# Patient Record
Sex: Female | Born: 1965 | Hispanic: No | Marital: Married | Smoking: Current every day smoker
Health system: Southern US, Community
[De-identification: ages and names within clinical notes are randomized; demographics above are authoritative.]

## PROBLEM LIST (undated history)

## (undated) DIAGNOSIS — E119 Type 2 diabetes mellitus without complications: Secondary | ICD-10-CM

## (undated) DIAGNOSIS — I10 Essential (primary) hypertension: Secondary | ICD-10-CM

## (undated) DIAGNOSIS — E7800 Pure hypercholesterolemia, unspecified: Secondary | ICD-10-CM

## (undated) HISTORY — DX: Type 2 diabetes mellitus without complications: E11.9

## (undated) HISTORY — PX: APPENDECTOMY: SHX54

---

## 1999-01-16 ENCOUNTER — Other Ambulatory Visit: Admission: RE | Admit: 1999-01-16 | Discharge: 1999-01-16 | Payer: Self-pay | Admitting: Obstetrics and Gynecology

## 1999-05-23 ENCOUNTER — Emergency Department (HOSPITAL_COMMUNITY): Admission: EM | Admit: 1999-05-23 | Discharge: 1999-05-23 | Payer: Self-pay | Admitting: Emergency Medicine

## 1999-05-23 ENCOUNTER — Encounter: Payer: Self-pay | Admitting: Emergency Medicine

## 2002-10-19 ENCOUNTER — Ambulatory Visit (HOSPITAL_COMMUNITY): Admission: RE | Admit: 2002-10-19 | Discharge: 2002-10-19 | Payer: Self-pay | Admitting: Family Medicine

## 2003-10-27 ENCOUNTER — Ambulatory Visit (HOSPITAL_COMMUNITY): Admission: RE | Admit: 2003-10-27 | Discharge: 2003-10-27 | Payer: Self-pay | Admitting: Family Medicine

## 2004-02-23 ENCOUNTER — Ambulatory Visit: Payer: Self-pay | Admitting: Nurse Practitioner

## 2004-03-01 ENCOUNTER — Ambulatory Visit: Payer: Self-pay | Admitting: Nurse Practitioner

## 2004-04-10 ENCOUNTER — Ambulatory Visit: Payer: Self-pay | Admitting: Nurse Practitioner

## 2004-07-18 ENCOUNTER — Ambulatory Visit: Payer: Self-pay | Admitting: Nurse Practitioner

## 2004-08-22 ENCOUNTER — Ambulatory Visit: Payer: Self-pay | Admitting: Nurse Practitioner

## 2004-08-24 ENCOUNTER — Ambulatory Visit (HOSPITAL_COMMUNITY): Admission: RE | Admit: 2004-08-24 | Discharge: 2004-08-24 | Payer: Self-pay | Admitting: Nurse Practitioner

## 2004-08-26 ENCOUNTER — Ambulatory Visit: Payer: Self-pay | Admitting: *Deleted

## 2004-12-03 ENCOUNTER — Ambulatory Visit: Payer: Self-pay | Admitting: Nurse Practitioner

## 2005-04-21 ENCOUNTER — Ambulatory Visit: Payer: Self-pay | Admitting: Nurse Practitioner

## 2005-07-27 ENCOUNTER — Emergency Department (HOSPITAL_COMMUNITY): Admission: EM | Admit: 2005-07-27 | Discharge: 2005-07-27 | Payer: Self-pay | Admitting: Emergency Medicine

## 2005-08-07 ENCOUNTER — Ambulatory Visit: Payer: Self-pay | Admitting: Nurse Practitioner

## 2005-10-29 ENCOUNTER — Encounter: Admission: RE | Admit: 2005-10-29 | Discharge: 2006-01-27 | Payer: Self-pay | Admitting: Sports Medicine

## 2005-11-24 ENCOUNTER — Ambulatory Visit: Payer: Self-pay | Admitting: Nurse Practitioner

## 2005-11-24 ENCOUNTER — Other Ambulatory Visit: Admission: RE | Admit: 2005-11-24 | Discharge: 2005-11-24 | Payer: Self-pay | Admitting: Nurse Practitioner

## 2005-11-24 ENCOUNTER — Encounter (INDEPENDENT_AMBULATORY_CARE_PROVIDER_SITE_OTHER): Payer: Self-pay | Admitting: *Deleted

## 2006-01-28 ENCOUNTER — Encounter: Admission: RE | Admit: 2006-01-28 | Discharge: 2006-04-28 | Payer: Self-pay | Admitting: Sports Medicine

## 2006-03-06 ENCOUNTER — Ambulatory Visit: Payer: Self-pay | Admitting: Internal Medicine

## 2006-08-28 ENCOUNTER — Ambulatory Visit: Payer: Self-pay | Admitting: Internal Medicine

## 2006-09-17 ENCOUNTER — Ambulatory Visit: Payer: Self-pay | Admitting: Internal Medicine

## 2006-11-25 ENCOUNTER — Encounter (INDEPENDENT_AMBULATORY_CARE_PROVIDER_SITE_OTHER): Payer: Self-pay | Admitting: *Deleted

## 2007-04-27 ENCOUNTER — Ambulatory Visit: Payer: Self-pay | Admitting: Family Medicine

## 2007-04-27 LAB — CONVERTED CEMR LAB
ALT: 25 units/L (ref 0–35)
AST: 17 units/L (ref 0–37)
CO2: 22 meq/L (ref 19–32)
Chloride: 107 meq/L (ref 96–112)
Cholesterol: 134 mg/dL (ref 0–200)
LDL Cholesterol: 55 mg/dL (ref 0–99)
Sodium: 141 meq/L (ref 135–145)
Total Bilirubin: 0.5 mg/dL (ref 0.3–1.2)
Total CHOL/HDL Ratio: 3.8
Total Protein: 7 g/dL (ref 6.0–8.3)
VLDL: 44 mg/dL — ABNORMAL HIGH (ref 0–40)

## 2008-01-18 ENCOUNTER — Ambulatory Visit: Payer: Self-pay | Admitting: Internal Medicine

## 2008-02-02 ENCOUNTER — Encounter (INDEPENDENT_AMBULATORY_CARE_PROVIDER_SITE_OTHER): Payer: Self-pay | Admitting: Internal Medicine

## 2008-02-02 ENCOUNTER — Ambulatory Visit: Payer: Self-pay | Admitting: Internal Medicine

## 2008-02-02 LAB — CONVERTED CEMR LAB
AST: 17 units/L (ref 0–37)
Albumin: 3.9 g/dL (ref 3.5–5.2)
BUN: 12 mg/dL (ref 6–23)
CO2: 20 meq/L (ref 19–32)
Calcium: 9.2 mg/dL (ref 8.4–10.5)
Chloride: 108 meq/L (ref 96–112)
Cholesterol: 111 mg/dL (ref 0–200)
Creatinine, Ser: 0.51 mg/dL (ref 0.40–1.20)
Glucose, Bld: 126 mg/dL — ABNORMAL HIGH (ref 70–99)
HDL: 34 mg/dL — ABNORMAL LOW (ref >39)
Hemoglobin: 14.4 g/dL (ref 12.0–15.0)
Lymphocytes Relative: 53 % — ABNORMAL HIGH (ref 12–46)
Lymphs Abs: 3 10*3/uL (ref 0.7–4.0)
MCHC: 32.6 g/dL (ref 30.0–36.0)
Monocytes Absolute: 0.4 10*3/uL (ref 0.1–1.0)
Monocytes Relative: 8 % (ref 3–12)
Neutro Abs: 2 10*3/uL (ref 1.7–7.7)
Neutrophils Relative %: 35 % — ABNORMAL LOW (ref 43–77)
Potassium: 3.9 meq/L (ref 3.5–5.3)
RBC: 5.34 M/uL — ABNORMAL HIGH (ref 3.87–5.11)
Total CHOL/HDL Ratio: 3.3
WBC: 5.6 10*3/uL (ref 4.0–10.5)

## 2008-08-29 ENCOUNTER — Ambulatory Visit: Payer: Self-pay | Admitting: Family Medicine

## 2008-08-30 ENCOUNTER — Ambulatory Visit (HOSPITAL_COMMUNITY): Admission: RE | Admit: 2008-08-30 | Discharge: 2008-08-30 | Payer: Self-pay | Admitting: Internal Medicine

## 2009-06-04 ENCOUNTER — Encounter (INDEPENDENT_AMBULATORY_CARE_PROVIDER_SITE_OTHER): Payer: Self-pay | Admitting: Family Medicine

## 2009-06-04 ENCOUNTER — Ambulatory Visit: Payer: Self-pay | Admitting: Internal Medicine

## 2009-06-04 LAB — CONVERTED CEMR LAB
Albumin: 4.7 g/dL (ref 3.5–5.2)
Alkaline Phosphatase: 57 units/L (ref 39–117)
BUN: 12 mg/dL (ref 6–23)
CO2: 26 meq/L (ref 19–32)
Cholesterol: 141 mg/dL (ref 0–200)
Glucose, Bld: 81 mg/dL (ref 70–99)
HDL: 40 mg/dL (ref >39)
LDL Cholesterol: 66 mg/dL (ref 0–99)
Sodium: 141 meq/L (ref 135–145)
Total Bilirubin: 0.5 mg/dL (ref 0.3–1.2)
Total Protein: 7.2 g/dL (ref 6.0–8.3)
Triglycerides: 174 mg/dL — ABNORMAL HIGH (ref <150)
VLDL: 35 mg/dL (ref 0–40)

## 2009-06-26 ENCOUNTER — Ambulatory Visit: Payer: Self-pay | Admitting: Internal Medicine

## 2009-07-12 ENCOUNTER — Ambulatory Visit: Payer: Self-pay | Admitting: Obstetrics and Gynecology

## 2009-07-26 ENCOUNTER — Ambulatory Visit: Payer: Self-pay | Admitting: Internal Medicine

## 2009-07-26 LAB — CONVERTED CEMR LAB
AST: 23 units/L (ref 0–37)
Albumin: 4.4 g/dL (ref 3.5–5.2)
Alkaline Phosphatase: 52 units/L (ref 39–117)
Calcium: 9 mg/dL (ref 8.4–10.5)
Chloride: 107 meq/L (ref 96–112)
Hep A Total Ab: POSITIVE — AB
Hep B S Ab: POSITIVE — AB
Potassium: 4.2 meq/L (ref 3.5–5.3)
Sodium: 142 meq/L (ref 135–145)
Total Protein: 6.6 g/dL (ref 6.0–8.3)

## 2009-08-03 ENCOUNTER — Ambulatory Visit: Payer: Self-pay | Admitting: Obstetrics & Gynecology

## 2009-08-15 ENCOUNTER — Ambulatory Visit: Payer: Self-pay | Admitting: Obstetrics & Gynecology

## 2009-09-14 ENCOUNTER — Ambulatory Visit: Payer: Self-pay | Admitting: Obstetrics and Gynecology

## 2010-02-21 ENCOUNTER — Encounter (INDEPENDENT_AMBULATORY_CARE_PROVIDER_SITE_OTHER): Payer: Self-pay | Admitting: *Deleted

## 2010-02-21 LAB — CONVERTED CEMR LAB
ALT: 29 units/L (ref 0–35)
AST: 22 units/L (ref 0–37)
LDL Cholesterol: 102 mg/dL — ABNORMAL HIGH (ref 0–99)

## 2010-05-27 LAB — POCT PREGNANCY, URINE
Preg Test, Ur: NEGATIVE
Preg Test, Ur: NEGATIVE

## 2016-09-24 ENCOUNTER — Encounter (HOSPITAL_COMMUNITY): Payer: Self-pay | Admitting: *Deleted

## 2016-09-24 ENCOUNTER — Emergency Department (HOSPITAL_COMMUNITY): Payer: No Typology Code available for payment source

## 2016-09-24 ENCOUNTER — Emergency Department (HOSPITAL_COMMUNITY)
Admission: EM | Admit: 2016-09-24 | Discharge: 2016-09-24 | Disposition: A | Discharge status: Home / Self Care | Payer: No Typology Code available for payment source | Attending: Emergency Medicine | Admitting: Emergency Medicine

## 2016-09-24 DIAGNOSIS — R0789 Other chest pain: Secondary | ICD-10-CM | POA: Diagnosis not present

## 2016-09-24 DIAGNOSIS — F17210 Nicotine dependence, cigarettes, uncomplicated: Secondary | ICD-10-CM | POA: Insufficient documentation

## 2016-09-24 DIAGNOSIS — S161XXA Strain of muscle, fascia and tendon at neck level, initial encounter: Secondary | ICD-10-CM | POA: Insufficient documentation

## 2016-09-24 DIAGNOSIS — Z79899 Other long term (current) drug therapy: Secondary | ICD-10-CM | POA: Diagnosis not present

## 2016-09-24 DIAGNOSIS — I10 Essential (primary) hypertension: Secondary | ICD-10-CM | POA: Insufficient documentation

## 2016-09-24 DIAGNOSIS — Y999 Unspecified external cause status: Secondary | ICD-10-CM | POA: Diagnosis not present

## 2016-09-24 DIAGNOSIS — S199XXA Unspecified injury of neck, initial encounter: Secondary | ICD-10-CM | POA: Diagnosis present

## 2016-09-24 DIAGNOSIS — Y9389 Activity, other specified: Secondary | ICD-10-CM | POA: Insufficient documentation

## 2016-09-24 DIAGNOSIS — Y929 Unspecified place or not applicable: Secondary | ICD-10-CM | POA: Insufficient documentation

## 2016-09-24 HISTORY — DX: Essential (primary) hypertension: I10

## 2016-09-24 HISTORY — DX: Pure hypercholesterolemia, unspecified: E78.00

## 2016-09-24 MED ORDER — NAPROXEN 375 MG PO TABS: 375.0000 mg | ORAL_TABLET | Freq: Two times a day (BID) | ORAL | 0 refills | Status: DC

## 2016-09-24 MED ORDER — CYCLOBENZAPRINE HCL 10 MG PO TABS: 10.0000 mg | ORAL_TABLET | Freq: Three times a day (TID) | ORAL | 0 refills | Status: DC | PRN

## 2016-09-24 MED ORDER — HYDROCODONE-ACETAMINOPHEN 5-325 MG PO TABS
1.0000 | ORAL_TABLET | Freq: Once | ORAL | Status: AC
  Administered 2016-09-24: 1 via ORAL
  Filled 2016-09-24: qty 1

## 2016-09-24 MED ORDER — NAPROXEN 250 MG PO TABS
375.0000 mg | ORAL_TABLET | Freq: Once | ORAL | Status: AC
  Administered 2016-09-24: 375 mg via ORAL
  Filled 2016-09-24: qty 2
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
## 2016-09-24 NOTE — Discharge Instructions (Signed)
All of your imaging has been normal. This is likely musculoskeletal pain. Please take the Naproxen as prescribed for pain. Do not take any additional NSAIDs including Motrin, Aleve, Ibuprofen, Advil. Have given your first dose today in the ED  Please the the Flexeril for muscle relaxation. This medication will make you drowsy so avoid situation that could place you in danger.   Warm compresses and warm soaks in Epsom salt. If you not feeling any better in the next 4-5 days follow-up with primary care doctor, orthopedist or return to the ED.

## 2016-12-18 ENCOUNTER — Ambulatory Visit (INDEPENDENT_AMBULATORY_CARE_PROVIDER_SITE_OTHER): Payer: BLUE CROSS/BLUE SHIELD | Admitting: Physician Assistant

## 2016-12-18 VITALS — BP 140/80 | HR 85 | Temp 97.7°F | Resp 16 | Ht <= 58 in | Wt 170.0 lb

## 2016-12-18 DIAGNOSIS — E785 Hyperlipidemia, unspecified: Secondary | ICD-10-CM

## 2016-12-18 DIAGNOSIS — N939 Abnormal uterine and vaginal bleeding, unspecified: Secondary | ICD-10-CM | POA: Diagnosis not present

## 2016-12-18 NOTE — Patient Instructions (Addendum)
I will contact you with the lab results.  We will then decide on the appropriate medicine for the cholesterol, if we need it Please await contact for referral to the gynecologist.       IF you received an x-ray today, you will receive an invoice from [facility]. Please contact [facility] at [phone] with questions or concerns regarding your invoice.   IF you received labwork today, you will receive an invoice from [facility]. Please contact [facility] at [phone] with questions or concerns regarding your invoice.   Our billing staff will not be able to assist you with questions regarding bills from these companies.  You will be contacted with the lab results as soon as they are available. The fastest way to get your results is to activate your My Chart account. Instructions are located on the last page of this paperwork. If you have not heard from [place] regarding the results in 2 weeks, please contact this office.

## 2016-12-18 NOTE — Progress Notes (Signed)
PRIMARY CARE AT [facility] [address], [place] [address] [phone]  Date:  12/18/2016   Name:  Tracy Sanders   DOB:  10-06-1965   MRN:  [id number]  PCP:  Patient, No Pcp Per    History of Present Illness:  Tracy Sanders is a 51 y.o. female patient who presents to PCP with  Chief Complaint  Patient presents with  . Medication Refill    pt was taking cholestrol med awhile back and would like blood work to check her levels.  . Menstrual Problem    pt wants to know why she is still getting a period at her age.     bosnian teletranslator utilized during visit. --6 years ago she was on a cholesterol medication, but she has not.  She did not have insurance at the time, so she had stopped.  She was diagnosed with elevated cholesterol for 11 years.   --she had no side effects with taking the cholesterol medication.   --soups, pies, homebaked everything.  She brings in food from home.   --she has some soda, more lemonade.   --no etOH use.    Menstrual problem --2 weeks of period --she may go through 5 pads per day.  She has heavy cramping.  She will have advil to help her symptoms.  She has bleeding with associated, fatigue, dizziness.  No chest pains.  5 children.  Vaginal delivery.  She has not had abnormal periods a lot.  She has some bloating.  Last visit was 2 years ago.    There are no active problems to display for this patient.   Past Medical History:  Diagnosis Date  . Hypercholesteremia   . Hypertension     Past Surgical History:  Procedure Laterality Date  . APPENDECTOMY      Social History  Substance Use Topics  . Smoking status: Current Every Day Smoker    Packs/day: 0.50  . Smokeless tobacco: Never Used  . Alcohol use No    No family history on file.  Allergies  Allergen Reactions  . Other     "a cold medicine"  - not sure which one    Medication list has been reviewed and updated.  No current outpatient prescriptions on file prior to  visit.   No current facility-administered medications on file prior to visit.     ROS ROS otherwise unremarkable unless listed above.  Physical Examination: BP 140/80   Pulse 85   Temp 97.7 F (36.5 C) (Oral)   Resp 16   Ht '4\' 9"'  (1.448 m)   Wt 170 lb (77.1 kg)   SpO2 98%   BMI 36.79 kg/m  Ideal Body Weight: Weight in (lb) to have BMI = 25: 115.3  Physical Exam  Constitutional: She is oriented to person, place, and time. She appears well-developed and well-nourished. No distress.  HENT:  Head: Normocephalic and atraumatic.  Right Ear: External ear normal.  Left Ear: External ear normal.  Eyes: Pupils are equal, round, and reactive to light. Conjunctivae and EOM are normal.  Cardiovascular: Normal rate.   Pulmonary/Chest: Effort normal. No respiratory distress.  Genitourinary: Vagina normal. Uterus is tender. Right adnexum displays no mass. Left adnexum displays no mass.  Neurological: She is alert and oriented to person, place, and time.  Skin: She is not diaphoretic.  Psychiatric: She has a normal mood and affect. Her behavior is normal.     Assessment and Plan: Tracy Sanders is a 51 y.o. female who is  here today for cc of  Chief Complaint  Patient presents with  . Medication Refill    pt was taking cholestrol med awhile back and would like blood work to check her levels.  . Menstrual Problem    pt wants to know why she is still getting a period at her age.  possible endometriosis, endometrial cancer, fibroids, myoma.   Referring to gynecology at this time.  Discussed the alternate plan of obtaining a vaginal ultrasound/pelvic, but she is agreeable to the referral. She will likely need additional labs--fsh/lh, estrogen, but will hold off for the consult. Adding TSH Abnormal uterine bleeding - Plan: CBC, CMP14+EGFR, Pap IG, CT/NG w/ reflex HPV when ASC-U, Ambulatory referral to Gynecology  Ivar Drape, PA-C [facility] 10/15/20188:10 AM

## 2016-12-19 LAB — CBC
Hematocrit: 41.5 % (ref 34.0–46.6)
Hemoglobin: 13.2 g/dL (ref 11.1–15.9)
MCH: 25 pg — ABNORMAL LOW (ref 26.6–33.0)
MCHC: 31.8 g/dL (ref 31.5–35.7)
MCV: 79 fL (ref 79–97)
PLATELETS: 200 10*3/uL (ref 150–379)
RBC: 5.27 x10E6/uL (ref 3.77–5.28)
RDW: 19.7 % — AB (ref 12.3–15.4)
WBC: 4.6 10*3/uL (ref 3.4–10.8)

## 2016-12-19 LAB — CMP14+EGFR
ALK PHOS: 75 IU/L (ref 39–117)
ALT: 17 IU/L (ref 0–32)
AST: 21 IU/L (ref 0–40)
Albumin/Globulin Ratio: 1.6 (ref 1.2–2.2)
Albumin: 4.2 g/dL (ref 3.5–5.5)
BUN/Creatinine Ratio: 17 (ref 9–23)
BUN: 10 mg/dL (ref 6–24)
Bilirubin Total: 0.3 mg/dL (ref 0.0–1.2)
CALCIUM: 9.3 mg/dL (ref 8.7–10.2)
CO2: 22 mmol/L (ref 20–29)
CREATININE: 0.6 mg/dL (ref 0.57–1.00)
Chloride: 105 mmol/L (ref 96–106)
GFR calc Af Amer: 120 mL/min/{1.73_m2} (ref >59)
GFR, EST NON AFRICAN AMERICAN: 104 mL/min/{1.73_m2} (ref >59)
GLUCOSE: 109 mg/dL — AB (ref 65–99)
Globulin, Total: 2.7 g/dL (ref 1.5–4.5)
Potassium: 4.2 mmol/L (ref 3.5–5.2)
SODIUM: 142 mmol/L (ref 134–144)
Total Protein: 6.9 g/dL (ref 6.0–8.5)

## 2016-12-22 ENCOUNTER — Telehealth: Payer: Self-pay | Admitting: Physician Assistant

## 2016-12-22 ENCOUNTER — Encounter: Payer: Self-pay | Admitting: Physician Assistant

## 2016-12-22 LAB — PAP IG, CT-NG, RFX HPV ASCU
Chlamydia, Nuc. Acid Amp: NEGATIVE
Gonococcus by Nucleic Acid Amp: NEGATIVE
PAP Smear Comment: 0

## 2016-12-22 NOTE — Addendum Note (Signed)
Addended by: Baldwin Crown D on: 12/22/2016 11:30 AM   Modules accepted: Orders

## 2016-12-22 NOTE — Telephone Encounter (Signed)
Please add the tsh to her lab

## 2016-12-22 NOTE — Addendum Note (Signed)
Addended by: Baldwin Crown D on: 12/22/2016 06:30 PM   Modules accepted: Orders

## 2016-12-22 NOTE — Telephone Encounter (Signed)
Please also add the lipid panel

## 2016-12-23 LAB — LIPID PANEL
CHOL/HDL RATIO: 4.9 ratio — AB (ref 0.0–4.4)
Cholesterol, Total: 241 mg/dL — ABNORMAL HIGH (ref 100–199)
HDL: 49 mg/dL (ref >39)
LDL CALC: 144 mg/dL — AB (ref 0–99)
TRIGLYCERIDES: 239 mg/dL — AB (ref 0–149)
VLDL Cholesterol Cal: 48 mg/dL — ABNORMAL HIGH (ref 5–40)

## 2016-12-23 LAB — TSH: TSH: 5.13 u[IU]/mL — ABNORMAL HIGH (ref 0.450–4.500)

## 2016-12-25 LAB — SPECIMEN STATUS REPORT

## 2016-12-25 LAB — T3, FREE: T3, Free: 2.7 pg/mL (ref 2.0–4.4)

## 2016-12-25 LAB — T4, FREE: Free T4: 1.02 ng/dL (ref 0.82–1.77)

## 2016-12-30 ENCOUNTER — Other Ambulatory Visit: Payer: Self-pay | Admitting: Physician Assistant

## 2016-12-30 MED ORDER — ATORVASTATIN CALCIUM 80 MG PO TABS: 80.0000 mg | ORAL_TABLET | Freq: Every day | ORAL | 1 refills | Status: DC

## 2016-12-31 ENCOUNTER — Ambulatory Visit (INDEPENDENT_AMBULATORY_CARE_PROVIDER_SITE_OTHER): Payer: BLUE CROSS/BLUE SHIELD | Admitting: Women's Health

## 2016-12-31 ENCOUNTER — Encounter: Payer: Self-pay | Admitting: Women's Health

## 2016-12-31 VITALS — BP 142/80 | Ht 59.0 in | Wt 173.0 lb

## 2016-12-31 DIAGNOSIS — N920 Excessive and frequent menstruation with regular cycle: Secondary | ICD-10-CM | POA: Diagnosis not present

## 2016-12-31 DIAGNOSIS — N938 Other specified abnormal uterine and vaginal bleeding: Secondary | ICD-10-CM | POA: Diagnosis not present

## 2016-12-31 NOTE — Progress Notes (Signed)
[address] June 29, 1965 [id number]    History:    Presents for new patient problem. [place]os[place], daughter interpreted.  History of cycles mostly monthly but lasting for 2 weeks for the last 3 years. Normal Pap history. Has not had a mammogram since 2010. Did have negative GC/Chlamydia, TSH slightly elevated T3 and 4 normal at primary care.  Hypertension and hypercholesterolemia primary care managing. Smoker, not ready to quit. Minimal menopausal symptoms. Reports sister age 53 menopause. Using no contraception, infrequent intercourse.  Past medical history, past surgical history, family history and social history were all reviewed and documented in the EPIC chart. Works at [employer]. 5 children.  ROS:  A ROS was performed and pertinent positives and negatives are included.  Exam:  Vitals:   12/31/16 0859  BP: (!) 142/80  Weight: 173 lb (78.5 kg)  Height: 4\' 11"  (1.499 m)   Body mass index is 34.94 kg/m.   General appearance:  Normal Thyroid:  Symmetrical, normal in size, without palpable masses or nodularity. Respiratory  Auscultation:  Clear without wheezing or rhonchi Cardiovascular  Auscultation:  Regular rate, without rubs, murmurs or gallops  Edema/varicosities:  Not grossly evident Abdominal  Soft,nontender, without masses, guarding or rebound.  Liver/spleen:  No organomegaly noted  Hernia:  None appreciated  Skin  Inspection:  Grossly normal   Breasts: Examined lying and sitting.     Right: Without masses, retractions, discharge or axillary adenopathy.     Left: Without masses, retractions, discharge or axillary adenopathy. Gentitourinary   Inguinal/mons:  Normal without inguinal adenopathy  External genitalia:  Normal  BUS/Urethra/Skene's glands:  Normal  Vagina:  Normal  Cervix:  Normal  Uterus: normal in size, shape and contour.  Midline and mobile  Adnexa/parametria:     Rt: Without masses or tenderness.   Lt: Without masses or tenderness.  Anus and  perineum: Normal  Digital rectal exam: Normal sphincter tone without palpated masses or tenderness  Assessment/Plan:  51 y.o. MWF G5 P5 for new patient problem visit.  DUB/cycles lasting 2 weeks for past 3 years Hypertension/hypercholesterolemia-primary care managing meds and labs Obesity Smoker  Plan: Reviewed hazards of smoking, discussed ways to decrease and quit. Schedule sonohysterogram with Dr. Audie BoxFontaine after next cycle. SBE's, reviewed importance of annual screening mammogram, breast center information given instructed to schedule. Screening colonoscopy, [facility] information given instructed to schedule. Encouraged to increase exercise and decrease calorie/carbohydrates for weight loss. Reviewed blood pressure slightly elevated, does have follow-up with primary care. Pap normal 12/2016 at primary care.    Harrington Challengerancy J Levetta Bognar [facility] Hospital[facility]NP, 9:41 AM 12/31/2016

## 2016-12-31 NOTE — Patient Instructions (Signed)
Breast center  [phone]  Mammogram  [facility]  Dr Leone PayorGessner  [phone]  Sonohysterogram A sonohysterogram is a procedure to examine the inside of the uterus. This exam uses sound waves that are sent to a computer to make images of the lining of the uterus (endometrium). To get the best images, a germ-free, salt-water solution (sterile saline) is put into the uterus through the vagina. You may have this procedure if you have certain reproductive problems, such as abnormal bleeding, infertility, or miscarriage. This procedure can show what may be causing these problems. Possible causes include scarring or abnormal growths such as fibroids inside your uterus. It can also show if your uterus is an abnormal shape or if the lining of the uterus is too thin. Tell a health care provider about:  All medicines you are taking, including vitamins, herbs, eye drops, creams, and over-the-counter medicines.  Any allergies you have.  Any blood disorders you have.  Any surgeries you have had.  Any medical conditions you have.  Whether you are pregnant or may be pregnant.  The date of the first day of your last period.  Any signs of infection, such as fever, pain in your lower abdomen, or abnormal discharge from your vagina. What are the risks? Generally, this is a safe procedure. However, problems may occur, including:  Abdominal pain or cramping.  Light bleeding (spotting).  Increased vaginal discharge.  Infection.  What happens before the procedure?  Your health care provider may have you take an over-the-counter pain medicine.  You may be given medicine to stop any abnormal bleeding.  You may be given antibiotic medicine to help prevent infection.  You may be asked to take a pregnancy test. This is usually in the form of a urine test.  You may have a pelvic exam.  You will be asked to empty your bladder. What happens during the procedure?  You will lie down on the exam table with  your feet in stirrups or with your knees bent and your feet flat on the table.  A slender, handheld device (transducer) will be lubricated and placed into your vagina.  The transducer will be positioned to send sound waves to your uterus. The sound waves are sent to a computer and are turned into images, which your health care provider sees during the procedure.  The transducer will be removed from your vagina.  An instrument will be inserted to widen the opening of your vagina (speculum).  A swab with germ-killing solution (antiseptic) will be used to clean the opening to your uterus (cervix).  A long, thin tube (catheter) will be placed through your cervix into your uterus.  The speculum will be removed.  The transducer will be placed back into your vagina to take more images.  Your uterus will be filled with a germ-free, salt-water solution (sterile saline) through the catheter. You may feel some cramping.  A fluid that contains air bubbles may be sent through the catheter to make it easier to see the fallopian tubes.  The transducer and catheter will be removed. The procedure may vary among health care providers and hospitals. What happens after the procedure?  It is up to you to get the results of your procedure. Ask your health care provider, or the department that is doing the procedure, when your results will be ready. Summary  A sonohysterogram is a procedure that creates images of the inside of the uterus.  The risks of this procedure are very low.  Most women experience cramping and spotting after the procedure.  You may need to have a pelvic exam and take a pregnancy test before this procedure. This procedure will not be done if you are pregnant or have an infection. This information is not intended to replace advice given to you by your health care provider. Make sure you discuss any questions you have with your health care provider. Document Released: 07/11/2013  Document Revised: 01/21/2016 Document Reviewed: 01/21/2016 Elsevier Interactive Patient Education  2017 [employer].

## 2017-01-12 ENCOUNTER — Telehealth: Payer: Self-pay | Admitting: *Deleted

## 2017-01-12 MED ORDER — MEGESTROL ACETATE 40 MG PO TABS: 40.0000 mg | ORAL_TABLET | Freq: Two times a day (BID) | ORAL | 0 refills | Status: DC

## 2017-01-12 NOTE — Telephone Encounter (Signed)
Pt daughter informed. Rx sent.

## 2017-01-12 NOTE — Telephone Encounter (Signed)
Patient daughter called c/o patient has been bleeding x 10days, heavy changing pads every 2 hours with clots, states last night she had to change bed sheets due to bleeding. Cramping along with bleeding, asked if Rx could be sent to pharmacy due to bleeding, has [facility] scheduled on 01/26/17. Please advise

## 2017-01-12 NOTE — Telephone Encounter (Signed)
Please call, Megace 40 mg twice daily until sonohysterogram, have her call if bleeding does not stop in the next few days- continue taking it until sonohysterogram appointment. Megace 40 mg twice daily for 10 days #20 tablets no refills

## 2017-01-13 ENCOUNTER — Other Ambulatory Visit: Payer: Self-pay | Admitting: Gynecology

## 2017-01-13 DIAGNOSIS — N939 Abnormal uterine and vaginal bleeding, unspecified: Secondary | ICD-10-CM

## 2017-01-26 ENCOUNTER — Encounter: Payer: Self-pay | Admitting: Gynecology

## 2017-01-26 ENCOUNTER — Ambulatory Visit (INDEPENDENT_AMBULATORY_CARE_PROVIDER_SITE_OTHER): Payer: BLUE CROSS/BLUE SHIELD

## 2017-01-26 ENCOUNTER — Ambulatory Visit: Payer: BLUE CROSS/BLUE SHIELD | Admitting: Gynecology

## 2017-01-26 ENCOUNTER — Telehealth: Payer: Self-pay

## 2017-01-26 ENCOUNTER — Other Ambulatory Visit: Payer: Self-pay | Admitting: Gynecology

## 2017-01-26 VITALS — BP 118/78

## 2017-01-26 DIAGNOSIS — N938 Other specified abnormal uterine and vaginal bleeding: Secondary | ICD-10-CM | POA: Diagnosis not present

## 2017-01-26 DIAGNOSIS — N920 Excessive and frequent menstruation with regular cycle: Secondary | ICD-10-CM | POA: Diagnosis not present

## 2017-01-26 DIAGNOSIS — N83202 Unspecified ovarian cyst, left side: Secondary | ICD-10-CM

## 2017-01-26 DIAGNOSIS — N926 Irregular menstruation, unspecified: Secondary | ICD-10-CM

## 2017-01-26 DIAGNOSIS — N939 Abnormal uterine and vaginal bleeding, unspecified: Secondary | ICD-10-CM | POA: Diagnosis not present

## 2017-01-26 NOTE — Patient Instructions (Addendum)
Office will call you with the hormone blood results and the biopsy results.  Follow-up for the repeat ultrasound to look at the left ovarian cyst in 3 months as scheduled

## 2017-01-26 NOTE — Progress Notes (Signed)
    Tracy Sanders Aug 04, 1965 [id number]        51 y.o.  [id number] presents for sonohysterogram having recently seen Tracy Sanders.  Accompanied by her daughter who is interpreting.  History of irregular bleeding over the last several years.  Will bleed up to 2 weeks straight.  Had IUD in the past but said that it was "lost ".  Placed on Megace by Tracy Sanders and now bleeding has resolved.  Past medical history,surgical history, problem list, medications, allergies, family history and social history were all reviewed and documented in the EPIC chart.  Directed ROS with pertinent positives and negatives documented in the history of present illness/assessment and plan.  Exam: Tracy Sanders assistant BP 118/78 General appearance:  Normal Abdomen soft nontender without masses guarding rebound Pelvic external BUS vagina with atrophic changes.  Cervix with atrophic changes.  Uterus grossly normal size midline mobile nontender.  Adnexa without masses or tenderness.  Ultrasound transvaginal and transabdominal shows uterus grossly normal with homogeneous echotexture.  Endometrial echo 3.1 mm.  Right ovary grossly normal seen transabdominally.  Left ovary with thin-walled echo-free avascular cyst 29 x 28 x 38 mm.  Cul-de-sac negative  Sonohysterogram performed, sterile technique, easy catheter introduction, good distention with no abnormalities.  Endometrial biopsy taken.  Patient tolerated well.  Assessment/Plan:  51 y.o. [id number] with irregular bleeding over the past several years.  Endometrial echoes thin with no endometrial defects.  Had taken Megace now with no bleeding.  Recommend hold on Megace for now.  Check baseline FSH for menopausal status.  Being evaluated now for elevated TSH.  Follow-up for biopsy results and FSH results.  Consider hormonal manipulation to regulate versus observation.  Will rediscuss after test results return.    Dara Lordsimothy P Miko Sirico MD, 12:26 PM 01/26/2017

## 2017-01-26 NOTE — Telephone Encounter (Signed)
[facility] to ask what source of biopsy was. She said the requistion says "uterus".  I advised her per Dr. Kristie CowmanF's office note that it is an endometrial biopsy.

## 2017-01-27 LAB — FOLLICLE STIMULATING HORMONE: FSH: 5.1 m[IU]/mL

## 2017-02-04 ENCOUNTER — Telehealth: Payer: Self-pay | Admitting: *Deleted

## 2017-02-04 MED ORDER — MEGESTROL ACETATE 40 MG PO TABS: ORAL_TABLET | ORAL | 0 refills | Status: DC

## 2017-02-04 NOTE — Telephone Encounter (Signed)
Patient daughter informed, Rx sent.

## 2017-02-04 NOTE — Telephone Encounter (Signed)
Patient daughter called to follow up, patient had [facility] on 01/26/17 and has been since procedure. Changing pads every 2 hours with clots, patient does have rx for megace 40 mg twice daily, not sure how many pills are left from Rx would need refill if prescribed. Please advise

## 2017-02-04 NOTE — Telephone Encounter (Signed)
Megace 40 mg twice daily for 1-2 days then daily for a total of 1 week.  Okay to refill #30.  Office visit if irregular bleeding continues.

## 2017-02-25 ENCOUNTER — Telehealth: Payer: Self-pay | Admitting: *Deleted

## 2017-02-25 NOTE — Telephone Encounter (Signed)
Patient daughter called to follow up from telephone call on 02/04/17 states bleeding as slowed down, but no stopped. No heavy, changing pad every 3 hours. I told daughter OV next step per Dr.Fontaine. Daughter will call back to schedule.

## 2017-03-23 ENCOUNTER — Ambulatory Visit: Payer: BLUE CROSS/BLUE SHIELD | Admitting: Gynecology

## 2017-03-23 ENCOUNTER — Encounter: Payer: Self-pay | Admitting: Gynecology

## 2017-03-23 VITALS — BP 124/78

## 2017-03-23 DIAGNOSIS — N921 Excessive and frequent menstruation with irregular cycle: Secondary | ICD-10-CM | POA: Diagnosis not present

## 2017-03-23 LAB — CBC WITH DIFFERENTIAL/PLATELET
Basophils Absolute: 51 cells/uL (ref 0–200)
Basophils Relative: 0.8 %
Eosinophils Absolute: 237 cells/uL (ref 15–500)
Eosinophils Relative: 3.7 %
HCT: 37.1 % (ref 35.0–45.0)
Hemoglobin: 11.7 g/dL (ref 11.7–15.5)
Lymphs Abs: 2266 cells/uL (ref 850–3900)
MCH: 26 pg — ABNORMAL LOW (ref 27.0–33.0)
MCHC: 31.5 g/dL — ABNORMAL LOW (ref 32.0–36.0)
MCV: 82.4 fL (ref 80.0–100.0)
MPV: 12.8 fL — ABNORMAL HIGH (ref 7.5–12.5)
Monocytes Relative: 9.8 %
Neutro Abs: 3219 cells/uL (ref 1500–7800)
Neutrophils Relative %: 50.3 %
PLATELETS: 224 10*3/uL (ref 140–400)
RBC: 4.5 10*6/uL (ref 3.80–5.10)
RDW: 13.4 % (ref 11.0–15.0)
TOTAL LYMPHOCYTE: 35.4 %
WBC: 6.4 10*3/uL (ref 3.8–10.8)
WBCMIX: 627 {cells}/uL (ref 200–950)

## 2017-03-23 MED ORDER — NORETHINDRONE ACETATE 5 MG PO TABS: 5.0000 mg | ORAL_TABLET | Freq: Every day | ORAL | 2 refills | Status: DC

## 2017-03-23 NOTE — Progress Notes (Signed)
    Tracy Sanders [birth year] [id number]        52 y.o.  [id number] presents in follow-up complaining of heavy irregular bleeding.  Has been going on for several years.  Was evaluated 01/26/2017 to include a negative sonohysterogram and endometrial biopsy showing secratory endometrium.  It was not bleeding at that time.  Was treated previously with Megace by Harriett SineNancy.  FSH 5.1 and hemoglobin 13.  Plan was for expectant management currently she bled on and off since then and is following up now.  Patient is requesting hysterectomy.  Patient presents with her daughter who is interpreting for her.  Past medical history,surgical history, problem list, medications, allergies, family history and social history were all reviewed and documented in the EPIC chart.  Directed ROS with pertinent positives and negatives documented in the history of present illness/assessment and plan.  Exam: Kennon PortelaKim Gardner assistant Vitals:   03/23/17 0924  BP: 124/78   General appearance:  Normal Abdomen soft nontender without masses guarding rebound Pelvic external BUS vagina with light menses flow.  Uterus grossly normal midline mobile nontender.  Adnexa without masses or tenderness.  Assessment/Plan:  52 y.o. [name] with persistent irregular bleeding in the perimenopause.  FSH was in the normal range last check.  Reviewed with the patient and her daughter who was interpreting think her irregular bleeding is mostly a perimenopausal irregularity.  Her ultrasound was normal and biopsy negative.  Although hysterectomy may be a possibility I think at this point we have more conservative options.  I reviewed the risks of surgery with them my preference to try to address this medically if possible.  We will go ahead and check baseline hemoglobin now.  Denies pregnancy possibility.  Encouraged a multivitamin with iron we will start her on Aygestin 5 mg daily to see if we cannot suppress her menses/bleeding.  Will stay on this daily for 2  months and then go from there.  Hopefully we can transition her through the menopause and avoid surgery.    Dara Lordsimothy P Akiyah Eppolito MD, 9:44 AM 03/23/2017

## 2017-03-23 NOTE — Patient Instructions (Signed)
Take the prescribed medication daily.  Follow-up if heavy bleeding continues.

## 2017-06-10 ENCOUNTER — Encounter: Payer: Self-pay | Admitting: Physician Assistant

## 2017-08-15 ENCOUNTER — Other Ambulatory Visit: Payer: Self-pay

## 2017-08-15 ENCOUNTER — Telehealth: Payer: Self-pay | Admitting: General Practice

## 2017-08-15 MED ORDER — ATORVASTATIN CALCIUM 80 MG PO TABS: 80.0000 mg | ORAL_TABLET | Freq: Every day | ORAL | 0 refills | Status: DC

## 2017-08-15 NOTE — Telephone Encounter (Signed)
Pt.'s daughter called to request refill of atorvastatin. Daughter Informed that provider [facility]. Scheduled visit with Dr. Creta LevinStallings on 08/17/17 at 1:20 pm for refill visit. [facility] two days for medication to keep patient until visit.

## 2017-08-15 NOTE — Telephone Encounter (Signed)
Refilled: sent 10 atorvastatin to pt pharmacy

## 2017-08-17 ENCOUNTER — Other Ambulatory Visit: Payer: Self-pay

## 2017-08-17 ENCOUNTER — Encounter: Payer: Self-pay | Admitting: Family Medicine

## 2017-08-17 ENCOUNTER — Ambulatory Visit (INDEPENDENT_AMBULATORY_CARE_PROVIDER_SITE_OTHER): Payer: BLUE CROSS/BLUE SHIELD | Admitting: Family Medicine

## 2017-08-17 VITALS — BP 142/87 | Temp 97.7°F | Ht 58.86 in | Wt 169.4 lb

## 2017-08-17 DIAGNOSIS — Z131 Encounter for screening for diabetes mellitus: Secondary | ICD-10-CM | POA: Diagnosis not present

## 2017-08-17 DIAGNOSIS — E785 Hyperlipidemia, unspecified: Secondary | ICD-10-CM

## 2017-08-17 DIAGNOSIS — E119 Type 2 diabetes mellitus without complications: Secondary | ICD-10-CM

## 2017-08-17 DIAGNOSIS — R42 Dizziness and giddiness: Secondary | ICD-10-CM

## 2017-08-17 DIAGNOSIS — Z5181 Encounter for therapeutic drug level monitoring: Secondary | ICD-10-CM

## 2017-08-17 DIAGNOSIS — N951 Menopausal and female climacteric states: Secondary | ICD-10-CM

## 2017-08-17 DIAGNOSIS — N939 Abnormal uterine and vaginal bleeding, unspecified: Secondary | ICD-10-CM | POA: Diagnosis not present

## 2017-08-17 DIAGNOSIS — F17200 Nicotine dependence, unspecified, uncomplicated: Secondary | ICD-10-CM | POA: Diagnosis not present

## 2017-08-17 DIAGNOSIS — D649 Anemia, unspecified: Secondary | ICD-10-CM | POA: Diagnosis not present

## 2017-08-17 LAB — POCT URINALYSIS DIP (MANUAL ENTRY)
BILIRUBIN UA: NEGATIVE
BILIRUBIN UA: NEGATIVE mg/dL
Glucose, UA: 100 mg/dL — AB
Leukocytes, UA: NEGATIVE
NITRITE UA: NEGATIVE
PH UA: 6 (ref 5.0–8.0)
Protein Ur, POC: 30 mg/dL — AB
Spec Grav, UA: 1.015 (ref 1.010–1.025)
Urobilinogen, UA: 0.2 E.U./dL

## 2017-08-17 LAB — POCT GLYCOSYLATED HEMOGLOBIN (HGB A1C): Hemoglobin A1C: 7.5 % — AB (ref 4.0–5.6)

## 2017-08-17 LAB — POCT CBC
Granulocyte percent: 51.3 %G (ref 37–80)
HCT, POC: 38.5 % (ref 37.7–47.9)
Hemoglobin: 12.2 g/dL (ref 12.2–16.2)
Lymph, poc: 2.2 (ref 0.6–3.4)
MCH: 22.1 pg — AB (ref 27–31.2)
MCHC: 31.7 g/dL — AB (ref 31.8–35.4)
MCV: 69.9 fL — AB (ref 80–97)
MID (CBC): 0.3 (ref 0–0.9)
MPV: 9.8 fL (ref 0–99.8)
POC Granulocyte: 2.7 (ref 2–6.9)
POC LYMPH PERCENT: 42.1 %L (ref 10–50)
POC MID %: 6.6 % (ref 0–12)
Platelet Count, POC: 211 10*3/uL (ref 142–424)
RBC: 5.5 M/uL — AB (ref 4.04–5.48)
WBC: 5.3 10*3/uL (ref 4.6–10.2)

## 2017-08-17 MED ORDER — METFORMIN HCL 500 MG PO TABS: 500.0000 mg | ORAL_TABLET | Freq: Every day | ORAL | 0 refills | Status: DC

## 2017-08-17 NOTE — Progress Notes (Signed)
Chief Complaint  Patient presents with  . Medication Refill    Needs bp meds refilled. Having abnormal heavy bleeding, curriently on birth control to regulate this problem. Doesn't seem to be helping  . Dizziness    HPI  Abnormal uterine bleeding/Perimenopause Patient in with heavy irregular bleeding in perimenopause She had prior IUD which fell out and got stuck She reports that she has been having heavy bleeding even on the Aygestin She does not have as many clots  She had a normal FSH and normal [place]  Lab Results  Component Value Date   HGB 12.2 08/17/2017   Dyslipidemia: Patient presents for evaluation of lipids.  Compliance with treatment thus far has been good.  A repeat fasting lipid profile was done.  The patient does use medications that may worsen dyslipidemias (corticosteroids, progestins, anabolic steroids, diuretics, beta-blockers, amiodarone, cyclosporine, olanzapine). She uses hormone contraception  The patient is not known to have coexisting coronary artery disease.   She has been out of her lipitor for 10 days  Lab Results  Component Value Date   CHOL 248 (H) 08/17/2017   CHOL 241 (H) 12/22/2016   CHOL 170 02/21/2010   Lab Results  Component Value Date   HDL 31 (L) 08/17/2017   HDL 49 12/22/2016   HDL 36 (L) 02/21/2010   Lab Results  Component Value Date   LDLCALC 139 (H) 08/17/2017   LDLCALC 144 (H) 12/22/2016   LDLCALC 102 (H) 02/21/2010   Lab Results  Component Value Date   TRIG 392 (H) 08/17/2017   TRIG 239 (H) 12/22/2016   TRIG 160 (H) 02/21/2010   Lab Results  Component Value Date   CHOLHDL 8.0 (H) 08/17/2017   CHOLHDL 4.9 (H) 12/22/2016   CHOLHDL 4.7 Ratio 02/21/2010   No results found for: LDLDIRECT   Dizziness She ate breakfast and her usual meals She drank water Her daughter reports that she seemed yellow Onset was 5 days ago    Past Medical History:  Diagnosis Date  . Hypercholesteremia   . Hypertension     Current  Outpatient Medications  Medication Sig Dispense Refill  . atorvastatin (LIPITOR) 80 MG tablet Take 1 tablet (80 mg total) by mouth daily. 15 tablet 0  . Ibuprofen (ADVIL PO) Take by mouth.    . megestrol (MEGACE) 40 MG tablet Take one tablet by mouth twice daily for 2 days, then 1 po daily for 1 week. 30 tablet 0  . norethindrone (AYGESTIN) 5 MG tablet Take 1 tablet (5 mg total) by mouth daily. 30 tablet 2  . metFORMIN (GLUCOPHAGE) 500 MG tablet Take 1 tablet (500 mg total) by mouth daily with breakfast. 90 tablet 0   No current facility-administered medications for this visit.     Allergies:  Allergies  Allergen Reactions  . Other     "a cold medicine"  - not sure which one    Past Surgical History:  Procedure Laterality Date  . APPENDECTOMY      Social History   Socioeconomic History  . Marital status: Married    Spouse name: Not on file  . Number of children: Not on file  . Years of education: Not on file  . Highest education level: Not on file  Occupational History  . Not on file  Social Needs  . Financial resource strain: Not on file  . Food insecurity:    Worry: Not on file    Inability: Not on file  . Transportation needs:  Medical: Not on file    Non-medical: Not on file  Tobacco Use  . Smoking status: Current Every Day Smoker    Packs/day: 0.50  . Smokeless tobacco: Never Used  Substance and Sexual Activity  . Alcohol use: No  . Drug use: No  . Sexual activity: Yes  Lifestyle  . Physical activity:    Days per week: Not on file    Minutes per session: Not on file  . Stress: Not on file  Relationships  . Social connections:    Talks on phone: Not on file    Gets together: Not on file    Attends religious service: Not on file    Active member of club or organization: Not on file    Attends meetings of clubs or organizations: Not on file    Relationship status: Not on file  Other Topics Concern  . Not on file  Social History Narrative  . Not on  file    Family History  Problem Relation Age of Onset  . Stroke Mother   . Hypertension Sister   . Diabetes Sister   . Hypertension Brother   . Diabetes Brother      ROS Review of Systems See HPI Constitution: No fevers or chills No malaise No diaphoresis Skin: No rash or itching Eyes: no blurry vision, no double vision GU: no dysuria or hematuria Neuro: no dizziness or headaches * all others reviewed and negative   Objective: Vitals:   08/17/17 1340  Temp: 97.7 F (36.5 C)  TempSrc: Oral  SpO2: 97%  Weight: 169 lb 6.4 oz (76.8 kg)  Height: 4' 10.86" (1.495 m)    Physical Exam  Constitutional: She is oriented to person, place, and time. She appears well-developed and well-nourished.  HENT:  Head: Normocephalic and atraumatic.  Mouth/Throat: Oropharynx is clear and moist. No oropharyngeal exudate.  Eyes: Conjunctivae and EOM are normal.  Neck: Normal range of motion. Neck supple. No thyromegaly present.  Cardiovascular: Normal rate, regular rhythm and normal heart sounds.  No murmur heard. Pulmonary/Chest: Effort normal and breath sounds normal. No stridor. No respiratory distress. She has no wheezes.  Abdominal: Soft. Bowel sounds are normal. She exhibits no distension and no mass. There is no tenderness. There is no guarding.  Musculoskeletal: Normal range of motion. She exhibits no edema.  Neurological: She is alert and oriented to person, place, and time.  Skin: Skin is warm. Capillary refill takes less than 2 seconds.  Psychiatric: She has a normal mood and affect. Her behavior is normal. Judgment and thought content normal.     ECG: nsr, no twi, no st elevation   Assessment and Plan Tracy Sanders was seen today for medication refill and dizziness.  Diagnoses and all orders for this visit:  Dizziness-  Screening for sugar derangement and electrolyte disorders ECG unconcerning -     Cancel: CBC -     Comprehensive metabolic panel -     Cancel:  Hemoglobin A1c -     EKG 12-Lead -     POCT CBC -     POCT glycosylated hemoglobin (Hb A1C) -     POCT urinalysis dipstick  Abnormal uterine bleeding Perimenopausal Mild anemia Will screen for anemia   Dyslipidemia- will check levels -     Lipid panel -     Comprehensive metabolic panel  Encounter for medication monitoring -     Lipid panel -     Comprehensive metabolic panel  Screening for diabetes mellitus -  will screen given dizziness is often a symptom -     Cancel: Hemoglobin A1c -     POCT glycosylated hemoglobin (Hb A1C)  Tobacco use disorder- pt in precontemplation  Newly diagnosed diabetes (HCC)- see below -     Microalbumin, urine -     metFORMIN (GLUCOPHAGE) 500 MG tablet; Take 1 tablet (500 mg total) by mouth daily with breakfast.     Joshua Zeringue A Danta Baumgardner  A total of 50 minutes were spent face-to-face with the patient during this encounter and over half of that time was spent on counseling and coordination of care.  At the end of the office visit it was noted that the patient had a1c of  Lab Results  Component Value Date   HGBA1C 7.5 (A) 08/17/2017   Since she was worked up for her dizziness and was quite anxious about her results an additional time was spent counseling the patient about her diabetes  She was provided education Regarding her diagnosis the decision was made to start metformin Risks and benefits were reviewed Smoking cessation was advised and the patient is in precontemplation Discussed vaccines Next visit in one month

## 2017-08-17 NOTE — Patient Instructions (Addendum)
Slow Fe over the counter iron is milder on the stomach, with less constipation, bloating and upset stomach   IF you received an x-ray today, you will receive an invoice from [facility]. Please contact [facility] at [phone] with questions or concerns regarding your invoice.   IF you received labwork today, you will receive an invoice from [facility]. Please contact [facility] at [phone] with questions or concerns regarding your invoice.   Our billing staff will not be able to assist you with questions regarding bills from these companies.  You will be contacted with the lab results as soon as they are available. The fastest way to get your results is to activate your My Chart account. Instructions are located on the last page of this paperwork. If you have not heard from [place] regarding the results in 2 weeks, please contact this office.     Diabetes Mellitus and Standards of Medical Care Managing diabetes (diabetes mellitus) can be complicated. Your diabetes treatment may be managed by a team of health care providers, including:  A diet and nutrition specialist (registered dietitian).  A nurse.  A certified diabetes educator (CDE).  A diabetes specialist (endocrinologist).  An eye doctor.  A primary care provider.  A dentist.  Your health care providers follow a schedule in order to help you get the best quality of care. The following schedule is a general guideline for your diabetes management plan. Your health care providers may also give you more specific instructions. HbA1c ( hemoglobin A1c) test This test provides information about blood sugar (glucose) control over the previous 2-3 months. It is used to check whether your diabetes management plan needs to be adjusted.  If you are meeting your treatment goals, this test is done at least 2 times a year.  If you are not meeting treatment goals or if your treatment goals have changed, this test is done  4 times a year.  Blood pressure test  This test is done at every routine medical visit. For most people, the goal is less than 130/80. Ask your health care provider what your goal blood pressure should be. Dental and eye exams  Visit your dentist two times a year.  If you have type 1 diabetes, get an eye exam 3-5 years after you are diagnosed, and then once a year after your first exam. ? If you were diagnosed with type 1 diabetes as a child, get an eye exam when you are age 59 or older and have had diabetes for 3-5 years. After the first exam, you should get an eye exam once a year.  If you have type 2 diabetes, have an eye exam as soon as you are diagnosed, and then once a year after your first exam. Foot care exam  Visual foot exams are done at every routine medical visit. The exams check for cuts, bruises, redness, blisters, sores, or other problems with the feet.  A complete foot exam is done by your health care provider once a year. This exam includes an inspection of the structure and skin of your feet, and a check of the pulses and sensation in your feet. ? Type 1 diabetes: Get your first exam 3-5 years after diagnosis. ? Type 2 diabetes: Get your first exam as soon as you are diagnosed.  Check your feet every day for cuts, bruises, redness, blisters, or sores. If you have any of these or other problems that are not healing, contact your health care provider. Kidney function  test ( urine microalbumin)  This test is done once a year. ? Type 1 diabetes: Get your first test 5 years after diagnosis. ? Type 2 diabetes: Get your first test as soon as you are diagnosed.  If you have chronic kidney disease (CKD), get a serum creatinine and estimated glomerular filtration rate (eGFR) test once a year. Lipid profile (cholesterol, HDL, LDL, triglycerides)  This test should be done when you are diagnosed with diabetes, and every 5 years after the first test. If you are on medicines to  lower your cholesterol, you may need to get this test done every year. ? The goal for LDL is less than 100 mg/dL (5.5 mmol/L). If you are at high risk, the goal is less than 70 mg/dL (3.9 mmol/L). ? The goal for HDL is 40 mg/dL (2.2 mmol/L) for men and 50 mg/dL(2.8 mmol/L) for women. An HDL cholesterol of 60 mg/dL (3.3 mmol/L) or higher gives some protection against heart disease. ? The goal for triglycerides is less than 150 mg/dL (8.3 mmol/L). Immunizations  The yearly flu (influenza) vaccine is recommended for everyone 6 months or older who has diabetes.  The pneumonia (pneumococcal) vaccine is recommended for everyone 2 years or older who has diabetes. If you are 78 or older, you may get the pneumonia vaccine as a series of two separate shots.  The hepatitis B vaccine is recommended for adults shortly after they have been diagnosed with diabetes.  The Tdap (tetanus, diphtheria, and pertussis) vaccine should be given: ? According to normal childhood vaccination schedules, for children. ? Every 10 years, for adults who have diabetes.  The shingles vaccine is recommended for people who have had chicken pox and are 50 years or older. Mental and emotional health  Screening for symptoms of eating disorders, anxiety, and depression is recommended at the time of diagnosis and afterward as needed. If your screening shows that you have symptoms (you have a positive screening result), you may need further evaluation and be referred to a mental health care provider. Diabetes self-management education  Education about how to manage your diabetes is recommended at diagnosis and ongoing as needed. Treatment plan  Your treatment plan will be reviewed at every medical visit. Summary  Managing diabetes (diabetes mellitus) can be complicated. Your diabetes treatment may be managed by a team of health care providers.  Your health care providers follow a schedule in order to help you get the best  quality of care.  Standards of care including having regular physical exams, blood tests, blood pressure monitoring, immunizations, screening tests, and education about how to manage your diabetes.  Your health care providers may also give you more specific instructions based on your individual health. This information is not intended to replace advice given to you by your health care provider. Make sure you discuss any questions you have with your health care provider. Document Released: 12/22/2008 Document Revised: 11/23/2015 Document Reviewed: 11/23/2015 Elsevier Interactive Patient Education  [employer].

## 2017-08-18 LAB — COMPREHENSIVE METABOLIC PANEL
ALT: 23 IU/L (ref 0–32)
AST: 19 IU/L (ref 0–40)
Albumin/Globulin Ratio: 1.4 (ref 1.2–2.2)
Albumin: 3.8 g/dL (ref 3.5–5.5)
Alkaline Phosphatase: 59 IU/L (ref 39–117)
BILIRUBIN TOTAL: 0.2 mg/dL (ref 0.0–1.2)
BUN/Creatinine Ratio: 21 (ref 9–23)
BUN: 12 mg/dL (ref 6–24)
CHLORIDE: 108 mmol/L — AB (ref 96–106)
CO2: 18 mmol/L — ABNORMAL LOW (ref 20–29)
Calcium: 8.6 mg/dL — ABNORMAL LOW (ref 8.7–10.2)
Creatinine, Ser: 0.57 mg/dL (ref 0.57–1.00)
GFR calc non Af Amer: 105 mL/min/{1.73_m2} (ref >59)
GFR, EST AFRICAN AMERICAN: 122 mL/min/{1.73_m2} (ref >59)
GLUCOSE: 234 mg/dL — AB (ref 65–99)
Globulin, Total: 2.8 g/dL (ref 1.5–4.5)
Potassium: 4.1 mmol/L (ref 3.5–5.2)
Sodium: 144 mmol/L (ref 134–144)
TOTAL PROTEIN: 6.6 g/dL (ref 6.0–8.5)

## 2017-08-18 LAB — LIPID PANEL
CHOL/HDL RATIO: 8 ratio — AB (ref 0.0–4.4)
Cholesterol, Total: 248 mg/dL — ABNORMAL HIGH (ref 100–199)
HDL: 31 mg/dL — AB (ref >39)
LDL Calculated: 139 mg/dL — ABNORMAL HIGH (ref 0–99)
Triglycerides: 392 mg/dL — ABNORMAL HIGH (ref 0–149)
VLDL Cholesterol Cal: 78 mg/dL — ABNORMAL HIGH (ref 5–40)

## 2017-08-18 LAB — MICROALBUMIN, URINE: MICROALBUM., U, RANDOM: 100.1 ug/mL

## 2017-08-23 ENCOUNTER — Encounter: Payer: Self-pay | Admitting: Family Medicine

## 2017-08-23 DIAGNOSIS — E119 Type 2 diabetes mellitus without complications: Secondary | ICD-10-CM | POA: Insufficient documentation

## 2017-08-23 DIAGNOSIS — N939 Abnormal uterine and vaginal bleeding, unspecified: Secondary | ICD-10-CM | POA: Insufficient documentation

## 2017-08-23 DIAGNOSIS — F17200 Nicotine dependence, unspecified, uncomplicated: Secondary | ICD-10-CM | POA: Insufficient documentation

## 2017-08-23 DIAGNOSIS — N951 Menopausal and female climacteric states: Secondary | ICD-10-CM | POA: Insufficient documentation

## 2017-08-31 ENCOUNTER — Other Ambulatory Visit: Payer: Self-pay | Admitting: Family Medicine

## 2017-09-01 ENCOUNTER — Telehealth: Payer: Self-pay | Admitting: Family Medicine

## 2017-09-01 NOTE — Telephone Encounter (Signed)
Copied from CRM [id number]. Topic: Quick Communication - See Telephone Encounter >> Sep 01, 2017  9:45 AM Lorrine KinMcGee, Arlin Sass B, NT wrote: CRM for notification. See Telephone encounter for: 09/01/17. Patient's daughter calling to check on the atorvastatin (LIPITOR) 80 MG tablet being sent to the pharmacy. States that the patient had a medication refill visit on 08/17/17 and the medication has not been sent yet. Please advise.  [id number] [place], [place] - [address]

## 2017-09-02 ENCOUNTER — Other Ambulatory Visit: Payer: Self-pay

## 2017-09-02 MED ORDER — ATORVASTATIN CALCIUM 80 MG PO TABS: 80.0000 mg | ORAL_TABLET | Freq: Every day | ORAL | 0 refills | Status: DC

## 2017-09-14 ENCOUNTER — Ambulatory Visit: Payer: BLUE CROSS/BLUE SHIELD | Admitting: Family Medicine

## 2017-09-17 ENCOUNTER — Other Ambulatory Visit: Payer: Self-pay

## 2017-09-17 ENCOUNTER — Ambulatory Visit: Payer: BLUE CROSS/BLUE SHIELD | Admitting: Family Medicine

## 2017-09-17 ENCOUNTER — Encounter: Payer: Self-pay | Admitting: Family Medicine

## 2017-09-17 VITALS — BP 128/84 | HR 88 | Temp 98.4°F | Ht 58.78 in | Wt 163.0 lb

## 2017-09-17 DIAGNOSIS — Z1211 Encounter for screening for malignant neoplasm of colon: Secondary | ICD-10-CM

## 2017-09-17 DIAGNOSIS — R030 Elevated blood-pressure reading, without diagnosis of hypertension: Secondary | ICD-10-CM | POA: Diagnosis not present

## 2017-09-17 DIAGNOSIS — I83893 Varicose veins of bilateral lower extremities with other complications: Secondary | ICD-10-CM | POA: Diagnosis not present

## 2017-09-17 DIAGNOSIS — E119 Type 2 diabetes mellitus without complications: Secondary | ICD-10-CM

## 2017-09-17 DIAGNOSIS — Z23 Encounter for immunization: Secondary | ICD-10-CM | POA: Diagnosis not present

## 2017-09-17 DIAGNOSIS — E785 Hyperlipidemia, unspecified: Secondary | ICD-10-CM

## 2017-09-17 LAB — GLUCOSE, POCT (MANUAL RESULT ENTRY): POC GLUCOSE: 182 mg/dL — AB (ref 70–99)

## 2017-09-17 MED ORDER — MEDICAL COMPRESSION STOCKINGS MISC: 1 refills | Status: DC

## 2017-09-17 MED ORDER — IBUPROFEN 600 MG PO TABS: 600.0000 mg | ORAL_TABLET | Freq: Three times a day (TID) | ORAL | 0 refills | Status: DC | PRN

## 2017-09-17 MED ORDER — GLUCOSE BLOOD VI STRP: ORAL_STRIP | 12 refills | Status: DC

## 2017-09-17 MED ORDER — BLOOD GLUCOSE METER KIT: PACK | 0 refills | Status: DC

## 2017-09-17 MED ORDER — LANCETS MISC: 6 refills | Status: DC

## 2017-09-17 MED ORDER — LANCING DEVICE MISC: 1 refills | Status: DC

## 2017-09-17 NOTE — Patient Instructions (Addendum)
IF you received an x-ray today, you will receive an invoice from [facility]. Please contact [facility] at [phone] with questions or concerns regarding your invoice.   IF you received labwork today, you will receive an invoice from [place]. Please contact [facility] at [phone] with questions or concerns regarding your invoice.   Our billing staff will not be able to assist you with questions regarding bills from these companies.  You will be contacted with the lab results as soon as they are available. The fastest way to get your results is to activate your My Chart account. Instructions are located on the last page of this paperwork. If you have not heard from [place] regarding the results in 2 weeks, please contact this office.     Varicose Veins Varicose veins are veins that have become enlarged and twisted. They are usually seen in the legs but can occur in other parts of the body as well. What are the causes? This condition is the result of valves in the veins not working properly. Valves in the veins help to return blood from the leg to the heart. If these valves are damaged, blood flows backward and backs up into the veins in the leg near the skin. This causes the veins to become larger. What increases the risk? People who are on their feet a lot, who are pregnant, or who are overweight are more likely to develop varicose veins. What are the signs or symptoms?  Bulging, twisted-appearing, bluish veins, most commonly found on the legs.  Leg pain or a feeling of heaviness. These symptoms may be worse at the end of the day.  Leg swelling.  Changes in skin color. How is this diagnosed? A health care provider can usually diagnose varicose veins by examining your legs. Your health care provider may also recommend an ultrasound of your leg veins. How is this treated? Most varicose veins can be treated at home.However, other treatments are available for  people who have persistent symptoms or want to improve the cosmetic appearance of the varicose veins. These treatment options include:  Sclerotherapy. A solution is injected into the vein to close it off.  Laser treatment. A laser is used to heat the vein to close it off.  Radiofrequency vein ablation. An electrical current produced by radio waves is used to close off the vein.  Phlebectomy. The vein is surgically removed through small incisions made over the varicose vein.  Vein ligation and stripping. The vein is surgically removed through incisions made over the varicose vein after the vein has been tied (ligated).  Follow these instructions at home:  Do not stand or sit in one position for long periods of time. Do not sit with your legs crossed. Rest with your legs raised during the day.  Wear compression stockings as directed by your health care provider. These stockings help to prevent blood clots and reduce swelling in your legs.  Do not wear other tight, encircling garments around your legs, pelvis, or waist.  Walk as much as possible to increase blood flow.  Raise the foot of your bed at night with 2-inch blocks.  If you get a cut in the skin over the vein and the vein bleeds, lie down with your leg raised and press on it with a clean cloth until the bleeding stops. Then place a bandage (dressing) on the cut. See your health care provider if it continues to bleed. Contact a health care provider if:  The skin around your ankle starts to break down.  You have pain, redness, tenderness, or hard swelling in your leg over a vein.  You are uncomfortable because of leg pain. This information is not intended to replace advice given to you by your health care provider. Make sure you discuss any questions you have with your health care provider. Document Released: 12/04/2004 Document Revised: 08/02/2015 Document Reviewed: 08/28/2015 Elsevier Interactive Patient Education  2017  [employer]

## 2017-09-17 NOTE — Progress Notes (Signed)
Chief Complaint  Patient presents with  . Follow-up    follow up on lab draw, Has lumps on the back of the right leg that daughter is very concernd about    HPI Hypertension Hypertension: Patient here for follow-up of elevated blood pressure. She is not exercising and is not adherent to low salt diet.  Blood pressure is well controlled at home. Cardiac symptoms none. Patient denies chest pain, chest pressure/discomfort, claudication, dyspnea, exertional chest pressure/discomfort, fatigue, lower extremity edema, near-syncope, orthopnea and palpitations.  Cardiovascular risk factors: diabetes mellitus, dyslipidemia, hypertension and obesity (BMI >= 30 kg/m2). Use of agents associated with hypertension: none. History of target organ damage: none. BP Readings from Last 3 Encounters:  09/17/17 128/84  08/17/17 (!) 142/87  03/23/17 124/78    Hyperlipidemia: Patient presents with hyperlipidemia.  She was tested because of screening. Denies chest pain, dyspnea, exertional chest pressure/discomfort, fatigue, lower extremity edema and palpitations. There is not a family history of hyperlipidemia. There is a family history of early ischemia heart disease. Lab Results  Component Value Date   CHOL 248 (H) 08/17/2017   CHOL 241 (H) 12/22/2016   CHOL 170 02/21/2010   Lab Results  Component Value Date   HDL 31 (L) 08/17/2017   HDL 49 12/22/2016   HDL 36 (L) 02/21/2010   Lab Results  Component Value Date   LDLCALC 139 (H) 08/17/2017   LDLCALC 144 (H) 12/22/2016   LDLCALC 102 (H) 02/21/2010   Lab Results  Component Value Date   TRIG 392 (H) 08/17/2017   TRIG 239 (H) 12/22/2016   TRIG 160 (H) 02/21/2010   Lab Results  Component Value Date   CHOLHDL 8.0 (H) 08/17/2017   CHOLHDL 4.9 (H) 12/22/2016   CHOLHDL 4.7 Ratio 02/21/2010   No results found for: LDLDIRECT  The 10-year ASCVD risk score Mikey Bussing DC Jr., et al., 2013) is: 18.5%   Values used to calculate the score:     Age: 85 years    Sex: Female     Is Non-Hispanic African American: No     Diabetic: Yes     Tobacco smoker: Yes     Systolic Blood Pressure: 527 mmHg     Is BP treated: No     HDL Cholesterol: 31 mg/dL     Total Cholesterol: 248 mg/dL   Varicose Veins Pt reports that she stands for work and notices that she gets a lump in her legs over the shin and the calf. Reports that sometimes there is a bluish color Worse if she has been standing for a while.  She denies any numbness or tingling.   Colon Cancer Screening SHe has never had a colonoscopy SHe denies blood in his stool, unexpected weight loss or pain with defecation No rectal itching SHe does not smoke SHe does not have a family history of colon cancer    Past Medical History:  Diagnosis Date  . Hypercholesteremia   . Hypertension     Current Outpatient Medications  Medication Sig Dispense Refill  . atorvastatin (LIPITOR) 80 MG tablet TAKE 1 TABLET BY MOUTH ONCE DAILY 90 tablet 0  . megestrol (MEGACE) 40 MG tablet Take one tablet by mouth twice daily for 2 days, then 1 po daily for 1 week. 30 tablet 0  . metFORMIN (GLUCOPHAGE) 500 MG tablet Take 1 tablet (500 mg total) by mouth daily with breakfast. 90 tablet 0  . norethindrone (AYGESTIN) 5 MG tablet Take 1 tablet (5 mg total) by mouth daily.  30 tablet 2  . blood glucose meter kit and supplies Dispense based on patient and insurance preference. Use up to four times daily as directed. (FOR ICD-10 E10.9, E11.9). 1 each 0  . Elastic Bandages & Supports (MEDICAL COMPRESSION STOCKINGS) MISC Wear daily to relieve pain from varicose veins. K74.259 2 each 1  . glucose blood (IGLUCOSE TEST STRIPS) test strip Use as instructed 100 each 12  . ibuprofen (ADVIL,MOTRIN) 600 MG tablet Take 1 tablet (600 mg total) by mouth every 8 (eight) hours as needed. 90 tablet 0  . Lancet Devices (LANCING DEVICE) MISC Use to check blood glucose once daily and as needed 1 each 1  . Lancets MISC Use to check blood  glucose once daily and as needed. E11.9 100 each 6   No current facility-administered medications for this visit.     Allergies:  Allergies  Allergen Reactions  . Other     "a cold medicine"  - not sure which one    Past Surgical History:  Procedure Laterality Date  . APPENDECTOMY      Social History   Socioeconomic History  . Marital status: Married    Spouse name: Not on file  . Number of children: Not on file  . Years of education: Not on file  . Highest education level: Not on file  Occupational History  . Not on file  Social Needs  . Financial resource strain: Not on file  . Food insecurity:    Worry: Not on file    Inability: Not on file  . Transportation needs:    Medical: Not on file    Non-medical: Not on file  Tobacco Use  . Smoking status: Current Every Day Smoker    Packs/day: 0.50  . Smokeless tobacco: Never Used  Substance and Sexual Activity  . Alcohol use: No  . Drug use: No  . Sexual activity: Yes  Lifestyle  . Physical activity:    Days per week: Not on file    Minutes per session: Not on file  . Stress: Not on file  Relationships  . Social connections:    Talks on phone: Not on file    Gets together: Not on file    Attends religious service: Not on file    Active member of club or organization: Not on file    Attends meetings of clubs or organizations: Not on file    Relationship status: Not on file  Other Topics Concern  . Not on file  Social History Narrative  . Not on file    Family History  Problem Relation Age of Onset  . Stroke Mother   . Hypertension Sister   . Diabetes Sister   . Hypertension Brother   . Diabetes Brother      ROS Review of Systems See HPI Constitution: No fevers or chills No malaise No diaphoresis Skin: No rash or itching Eyes: no blurry vision, no double vision GU: no dysuria or hematuria Neuro: no dizziness or headaches all others reviewed and negative   Objective: Vitals:   09/17/17  1128  BP: 128/84  Pulse: 88  Temp: 98.4 F (36.9 C)  TempSrc: Oral  SpO2: 99%  Weight: 163 lb (73.9 kg)  Height: 4' 10.78" (1.493 m)    Physical Exam  Constitutional: She is oriented to person, place, and time. She appears well-developed and well-nourished.  HENT:  Head: Normocephalic and atraumatic.  Eyes: Conjunctivae and EOM are normal.  Cardiovascular: Normal rate, regular rhythm and  normal heart sounds.  No murmur heard. Pulmonary/Chest: Effort normal and breath sounds normal. No stridor. No respiratory distress.  Musculoskeletal:  Left leg with tortuous blue superficial veins  Neurological: She is alert and oriented to person, place, and time.  Skin: Skin is warm. Capillary refill takes less than 2 seconds.  Psychiatric: She has a normal mood and affect. Her behavior is normal. Judgment and thought content normal.    Assessment and Plan Tracy Sanders was seen today for follow-up.  Diagnoses and all orders for this visit:  Newly diagnosed diabetes ([place])- discussed diagnosis and mgmt Reviewed standard of care for diabetes -     Tdap vaccine greater than or equal to 7yo IM -     HM Diabetes Foot Exam -     POCT glucose (manual entry) -     Pneumococcal polysaccharide vaccine 23-valent greater than or equal to 2yo subcutaneous/IM  Need for Tdap vaccination -     Tdap vaccine greater than or equal to 7yo IM  Need for prophylactic vaccination against Streptococcus pneumoniae (pneumococcus) -     Pneumococcal polysaccharide vaccine 23-valent greater than or equal to 2yo subcutaneous/IM  Special screening for malignant neoplasms, colon-  -discussed options for colon cancer screenings, reviewed family history, discussed options for testing.  -pt decided to decline/proceed with cologuard/colonoscopy/FOBT -     Cancel: Cologuard -     Cologuard; Future  Varicose veins of bilateral lower extremities with other complications -  Discussed supportive care Prescribed support  hoses  Other orders -     Elastic Bandages & Supports ([place]) MISC; Wear daily to relieve pain from varicose veins. E31.540 -     ibuprofen (ADVIL,MOTRIN) 600 MG tablet; Take 1 tablet (600 mg total) by mouth every 8 (eight) hours as needed. -     blood glucose meter kit and supplies; Dispense based on patient and insurance preference. Use up to four times daily as directed. (FOR ICD-10 E10.9, E11.9). -     glucose blood (IGLUCOSE TEST STRIPS) test strip; Use as instructed -     Lancets MISC; Use to check blood glucose once daily and as needed. E11.9 -     Lancet Devices (LANCING DEVICE) MISC; Use to check blood glucose once daily and as needed     [place]

## 2017-11-13 ENCOUNTER — Other Ambulatory Visit: Payer: Self-pay | Admitting: Family Medicine

## 2017-11-13 NOTE — Telephone Encounter (Signed)
metformin refill Last Refill:08/17/17 # 90 Last OV: 08/17/17 PCP: Dr Creta Levin Pharmacy:[facility] [phone] [employer]

## 2017-11-23 ENCOUNTER — Encounter: Payer: Self-pay | Admitting: Family Medicine

## 2017-11-23 ENCOUNTER — Ambulatory Visit: Payer: BLUE CROSS/BLUE SHIELD | Admitting: Family Medicine

## 2017-11-23 ENCOUNTER — Other Ambulatory Visit: Payer: Self-pay

## 2017-11-23 VITALS — BP 154/97 | HR 77 | Temp 98.0°F | Resp 17 | Ht 58.78 in | Wt 169.6 lb

## 2017-11-23 DIAGNOSIS — E119 Type 2 diabetes mellitus without complications: Secondary | ICD-10-CM | POA: Diagnosis not present

## 2017-11-23 DIAGNOSIS — N926 Irregular menstruation, unspecified: Secondary | ICD-10-CM | POA: Diagnosis not present

## 2017-11-23 DIAGNOSIS — Z1239 Encounter for other screening for malignant neoplasm of breast: Secondary | ICD-10-CM

## 2017-11-23 DIAGNOSIS — E785 Hyperlipidemia, unspecified: Secondary | ICD-10-CM

## 2017-11-23 DIAGNOSIS — R718 Other abnormality of red blood cells: Secondary | ICD-10-CM

## 2017-11-23 DIAGNOSIS — Z1231 Encounter for screening mammogram for malignant neoplasm of breast: Secondary | ICD-10-CM

## 2017-11-23 DIAGNOSIS — I10 Essential (primary) hypertension: Secondary | ICD-10-CM

## 2017-11-23 LAB — LIPID PANEL
CHOL/HDL RATIO: 2.6 ratio (ref 0.0–4.4)
Cholesterol, Total: 128 mg/dL (ref 100–199)
HDL: 50 mg/dL (ref >39)
LDL CALC: 57 mg/dL (ref 0–99)
Triglycerides: 104 mg/dL (ref 0–149)
VLDL Cholesterol Cal: 21 mg/dL (ref 5–40)

## 2017-11-23 LAB — COMPREHENSIVE METABOLIC PANEL
ALT: 30 IU/L (ref 0–32)
AST: 25 IU/L (ref 0–40)
Albumin/Globulin Ratio: 1.5 (ref 1.2–2.2)
Albumin: 3.8 g/dL (ref 3.5–5.5)
Alkaline Phosphatase: 76 IU/L (ref 39–117)
BUN/Creatinine Ratio: 22 (ref 9–23)
BUN: 11 mg/dL (ref 6–24)
Bilirubin Total: <0.2 mg/dL (ref 0.0–1.2)
CALCIUM: 8.6 mg/dL — AB (ref 8.7–10.2)
CO2: 20 mmol/L (ref 20–29)
Chloride: 108 mmol/L — ABNORMAL HIGH (ref 96–106)
Creatinine, Ser: 0.5 mg/dL — ABNORMAL LOW (ref 0.57–1.00)
GFR calc Af Amer: 127 mL/min/{1.73_m2} (ref >59)
GFR, EST NON AFRICAN AMERICAN: 110 mL/min/{1.73_m2} (ref >59)
GLUCOSE: 120 mg/dL — AB (ref 65–99)
Globulin, Total: 2.6 g/dL (ref 1.5–4.5)
Potassium: 4.3 mmol/L (ref 3.5–5.2)
Sodium: 140 mmol/L (ref 134–144)
Total Protein: 6.4 g/dL (ref 6.0–8.5)

## 2017-11-23 LAB — POCT GLYCOSYLATED HEMOGLOBIN (HGB A1C): Hemoglobin A1C: 6.5 % — AB (ref 4.0–5.6)

## 2017-11-23 LAB — POCT URINE PREGNANCY: Preg Test, Ur: NEGATIVE

## 2017-11-23 MED ORDER — LISINOPRIL 10 MG PO TABS: 10.0000 mg | ORAL_TABLET | Freq: Every day | ORAL | 1 refills | Status: DC

## 2017-11-23 NOTE — Progress Notes (Signed)
Chief Complaint  Patient presents with  . cholesterol/diabetes    2 months  . Medication Refill    lipitor and glucophage  . Amenorrhea    x 2 months    HPI  Perimenopause  She was having 2 periods a month and now no periods for 2 months with spotting She states that she just wants to make sure she is not pregnant +hot flashes +mood swings Menarche 9  Depression screen [facility] 2/9 11/23/2017 09/17/2017 08/17/2017 12/18/2016  Decreased Interest 0 0 0 0  Down, Depressed, Hopeless 0 0 0 0  PHQ - 2 Score 0 0 0 0    Diabetes Mellitus: Patient presents for follow up of diabetes. Symptoms: hyperglycemia and hypoglycemia . Symptoms have gradually improved. Patient denies foot ulcerations, nausea, paresthesia of the feet, polydipsia and polyuria.  Evaluation to date has been included: hemoglobin A1C.  Home sugars: her daughter checks her sugars but she does not know the readings but can be high or low.  Lab Results  Component Value Date   HGBA1C 6.5 (A) 11/23/2017    Wt Readings from Last 3 Encounters:  11/23/17 169 lb 9.6 oz (76.9 kg)  09/17/17 163 lb (73.9 kg)  08/17/17 169 lb 6.4 oz (76.8 kg)    Lab Results  Component Value Date   HGBA1C 6.5 (A) 11/23/2017   Hypertension: Patient here for follow-up of elevated blood pressure. She is not exercising and is adherent to low salt diet.  Blood pressure is not well controlled at home. Cardiac symptoms none. Patient denies chest pain, chest pressure/discomfort, claudication, exertional chest pressure/discomfort, fatigue and lower extremity edema.  Cardiovascular risk factors: diabetes mellitus, dyslipidemia and hypertension. Use of agents associated with hypertension: none. History of target organ damage: none. BP Readings from Last 3 Encounters:  11/23/17 (!) 154/97  09/17/17 128/84  08/17/17 (!) 142/87      Past Medical History:  Diagnosis Date  . Hypercholesteremia   . Hypertension     Current Outpatient Medications    Medication Sig Dispense Refill  . atorvastatin (LIPITOR) 80 MG tablet TAKE 1 TABLET BY MOUTH ONCE DAILY 90 tablet 0  . blood glucose meter kit and supplies Dispense based on patient and insurance preference. Use up to four times daily as directed. (FOR ICD-10 E10.9, E11.9). 1 each 0  . Elastic Bandages & Supports (MEDICAL COMPRESSION STOCKINGS) MISC Wear daily to relieve pain from varicose veins. O29.476 2 each 1  . glucose blood (IGLUCOSE TEST STRIPS) test strip Use as instructed 100 each 12  . ibuprofen (ADVIL,MOTRIN) 600 MG tablet Take 1 tablet (600 mg total) by mouth every 8 (eight) hours as needed. 90 tablet 0  . Lancet Devices (LANCING DEVICE) MISC Use to check blood glucose once daily and as needed 1 each 1  . Lancets MISC Use to check blood glucose once daily and as needed. E11.9 100 each 6  . megestrol (MEGACE) 40 MG tablet Take one tablet by mouth twice daily for 2 days, then 1 po daily for 1 week. 30 tablet 0  . metFORMIN (GLUCOPHAGE) 500 MG tablet TAKE 1 TABLET BY MOUTH ONCE DAILY WITH BREAKFAST 90 tablet 0  . norethindrone (AYGESTIN) 5 MG tablet Take 1 tablet (5 mg total) by mouth daily. 30 tablet 2  . lisinopril (PRINIVIL,ZESTRIL) 10 MG tablet Take 1 tablet (10 mg total) by mouth daily. 90 tablet 1   No current facility-administered medications for this visit.     Allergies:  Allergies  Allergen Reactions  .  Other     "a cold medicine"  - not sure which one    Past Surgical History:  Procedure Laterality Date  . APPENDECTOMY      Social History   Socioeconomic History  . Marital status: Married    Spouse name: Not on file  . Number of children: Not on file  . Years of education: Not on file  . Highest education level: Not on file  Occupational History  . Not on file  Social Needs  . Financial resource strain: Not on file  . Food insecurity:    Worry: Not on file    Inability: Not on file  . Transportation needs:    Medical: Not on file    Non-medical: Not  on file  Tobacco Use  . Smoking status: Current Every Day Smoker    Packs/day: 0.50  . Smokeless tobacco: Never Used  Substance and Sexual Activity  . Alcohol use: No  . Drug use: No  . Sexual activity: Yes  Lifestyle  . Physical activity:    Days per week: Not on file    Minutes per session: Not on file  . Stress: Not on file  Relationships  . Social connections:    Talks on phone: Not on file    Gets together: Not on file    Attends religious service: Not on file    Active member of club or organization: Not on file    Attends meetings of clubs or organizations: Not on file    Relationship status: Not on file  Other Topics Concern  . Not on file  Social History Narrative  . Not on file    Family History  Problem Relation Age of Onset  . Stroke Mother   . Hypertension Sister   . Diabetes Sister   . Hypertension Brother   . Diabetes Brother      ROS Review of Systems See HPI Constitution: No fevers or chills No malaise No diaphoresis Skin: No rash or itching Eyes: no blurry vision, no double vision GU: no dysuria or hematuria Neuro: no dizziness or headaches  all others reviewed and negative   Objective: Vitals:   11/23/17 0859  BP: (!) 154/97  Pulse: 77  Resp: 17  Temp: 98 F (36.7 C)  TempSrc: Oral  SpO2: 98%  Weight: 169 lb 9.6 oz (76.9 kg)  Height: 4' 10.78" (1.493 m)     Physical Exam  Constitutional: She is oriented to person, place, and time. She appears well-developed and well-nourished.  HENT:  Head: Normocephalic and atraumatic.  Eyes: Conjunctivae and EOM are normal.  Cardiovascular: Normal rate, regular rhythm and normal heart sounds.  No murmur heard. Pulmonary/Chest: Effort normal and breath sounds normal. No stridor. No respiratory distress. She has no wheezes. Right breast exhibits no inverted nipple, no mass, no nipple discharge, no skin change and no tenderness. Left breast exhibits no inverted nipple, no mass, no nipple  discharge, no skin change and no tenderness.  Musculoskeletal: Normal range of motion. She exhibits no edema.  Neurological: She is alert and oriented to person, place, and time.  Skin: Skin is warm. Capillary refill takes less than 2 seconds.  Psychiatric: She has a normal mood and affect. Her behavior is normal. Judgment and thought content normal.   Lab Results  Component Value Date   WBC 5.3 08/17/2017   HGB 12.2 08/17/2017   HCT 38.5 08/17/2017   MCV 69.9 (A) 08/17/2017   PLT 224 03/23/2017  Pregnancy test negative  Assessment and Plan Allysa was seen today for cholesterol/diabetes, medication refill and amenorrhea.  Diagnoses and all orders for this visit:  Diabetes mellitus type 2, noninsulin dependent ([place])- well controlled hemoglobin a1c is at goal Continue exercise Lipids monitored and renal function in range On metformin On ace  Reviewed diabetic foot care Emphasized importance of eye and dental exam    -     Comprehensive metabolic panel -     POCT glycosylated hemoglobin (Hb A1C) -     Ambulatory referral to diabetic education  Screening breast examination -     Cancel: MM Digital Screening; Future -     MM 3D SCREEN BREAST BILATERAL; Future  Missed period- negative pregnancy test per protocol Pt perimenopausal -     POCT urine pregnancy  Dyslipidemia- Discussed medications that affect lipids Reminded patient to avoid grapefruits Advised dietary fiber and fish oil and ways to keep HDL high -     Lipid panel -     Comprehensive metabolic panel  Microcytic red blood cells- reviewed cbc from last visit and advised iron rich foods  Essential hypertension- bp elevated Discussed DASH diet  Other orders -     Cancel: POCT glucose (manual entry) -     lisinopril (PRINIVIL,ZESTRIL) 10 MG tablet; Take 1 tablet (10 mg total) by mouth daily.     [place]

## 2017-11-23 NOTE — Patient Instructions (Addendum)
Component                     Latest Ref Rng & Units 08/17/2017 11/23/2017  Hemoglobin A1C                    4.0 - 5.6 % 7.5 (A) 6.5 (A)    For low iron Slow Fe once a day to help the iron and anemia.  She can buy this over the counter   ---------------------------------------------------------------  If you have lab work done today you will be contacted with your lab results within the next 2 weeks.  If you have not heard from [place] then please contact us. The fastest way to get your results is to register for My Chart.   IF you received an x-ray today, you will receive an invoice from [facility]. Please contact [facility] at [phone] with questions or concerns regarding your invoice.   IF you received labwork today, you will receive an invoice from [place]. Please contact [facility] at [phone] with questions or concerns regarding your invoice.   Our billing staff will not be able to assist you with questions regarding bills from these companies.  You will be contacted with the lab results as soon as they are available. The fastest way to get your results is to activate your My Chart account. Instructions are located on the last page of this paperwork. If you have not heard from [place] regarding the results in 2 weeks, please contact this office.     Iron-Rich Diet Iron is a mineral that helps your body to produce hemoglobin. Hemoglobin is a protein in your red blood cells that carries oxygen to your body's tissues. Eating too little iron may cause you to feel weak and tired, and it can increase your risk for infection. Eating enough iron is necessary for your body's metabolism, muscle function, and nervous system. Iron is naturally found in many foods. It can also be added to foods or fortified in foods. There are two types of dietary iron:  Heme iron. Heme iron is absorbed by the body more easily than nonheme iron. Heme iron is found in meat, poultry, and  fish.  Nonheme iron. Nonheme iron is found in dietary supplements, iron-fortified grains, beans, and vegetables.  You may need to follow an iron-rich diet if:  You have been diagnosed with iron deficiency or iron-deficiency anemia.  You have a condition that prevents you from absorbing dietary iron, such as: ? Infection in your intestines. ? Celiac disease. This involves long-lasting (chronic) inflammation of your intestines.  You do not eat enough iron.  You eat a diet that is high in foods that impair iron absorption.  You have lost a lot of blood.  You have heavy bleeding during your menstrual cycle.  You are pregnant.  What is my plan? Your health care provider may help you to determine how much iron you need per day based on your condition. Generally, when a person consumes sufficient amounts of iron in the diet, the following iron needs are met:  Men. ? 74-29 years old: 11 mg per day. ? 73-28 years old: 8 mg per day.  Women. ? 67-74 years old: 15 mg per day. ? 30-66 years old: 18 mg per day. ? Over 3 years old: 8 mg per day. ? Pregnant women: 27 mg per day. ? Breastfeeding women: 9 mg per day.  What do I need to know about an iron-rich diet?  Eat fresh fruits and vegetables that are high in vitamin C along with foods that are high in iron. This will help increase the amount of iron that your body absorbs from food, especially with foods containing nonheme iron. Foods that are high in vitamin C include oranges, peppers, tomatoes, and mango.  Take iron supplements only as directed by your health care provider. Overdose of iron can be life-threatening. If you were prescribed iron supplements, take them with orange juice or a vitamin C supplement.  Cook foods in pots and pans that are made from iron.  Eat nonheme iron-containing foods alongside foods that are high in heme iron. This helps to improve your iron absorption.  Certain foods and drinks contain compounds  that impair iron absorption. Avoid eating these foods in the same meal as iron-rich foods or with iron supplements. These include: ? Coffee, black tea, and red wine. ? Milk, dairy products, and foods that are high in calcium. ? Beans, soybeans, and peas. ? Whole grains.  When eating foods that contain both nonheme iron and compounds that impair iron absorption, follow these tips to absorb iron better. ? Soak beans overnight before cooking. ? Soak whole grains overnight and drain them before using. ? Ferment flours before baking, such as using yeast in bread dough. What foods can I eat? Grains Iron-fortified breakfast cereal. Iron-fortified whole-wheat bread. Enriched rice. Sprouted grains. Vegetables Spinach. Potatoes with skin. Green peas. Broccoli. Red and green bell peppers. Fermented vegetables. Fruits Prunes. Raisins. Oranges. Strawberries. Mango. Grapefruit. Meats and Other Protein Sources Beef liver. Oysters. Beef. Shrimp. [place]. Chicken. [place]. Sardines. Chickpeas. Nuts. Tofu. Beverages Tomato juice. Fresh orange juice. Prune juice. Hibiscus tea. Fortified instant breakfast shakes. Condiments Tahini. Fermented soy sauce. Sweets and Desserts Black-strap molasses. Other Wheat germ. The items listed above may not be a complete list of recommended foods or beverages. Contact your dietitian for more options. What foods are not recommended? Grains Whole grains. Bran cereal. Bran flour. Oats. Vegetables Artichokes. Brussels sprouts. Kale. Fruits Blueberries. Raspberries. Strawberries. Figs. Meats and Other Protein Sources Soybeans. Products made from soy protein. Dairy Milk. Cream. Cheese. Yogurt. Cottage cheese. Beverages Coffee. Black tea. Red wine. Sweets and Desserts Cocoa. Chocolate. Ice cream. Other Basil. Oregano. Parsley. The items listed above may not be a complete list of foods and beverages to avoid. Contact your dietitian for more information. This  information is not intended to replace advice given to you by your health care provider. Make sure you discuss any questions you have with your health care provider. Document Released: 10/08/2004 Document Revised: 09/14/2015 Document Reviewed: 09/21/2013 Elsevier Interactive Patient Education  [employer].

## 2017-12-04 ENCOUNTER — Encounter: Payer: Self-pay | Admitting: *Deleted

## 2017-12-04 ENCOUNTER — Encounter: Payer: Self-pay | Admitting: Family Medicine

## 2017-12-26 ENCOUNTER — Other Ambulatory Visit: Payer: Self-pay | Admitting: Family Medicine

## 2018-03-22 ENCOUNTER — Other Ambulatory Visit: Payer: Self-pay | Admitting: Family Medicine

## 2018-03-22 ENCOUNTER — Encounter: Payer: Self-pay | Admitting: Family Medicine

## 2018-03-22 MED ORDER — METFORMIN HCL 500 MG PO TABS: ORAL_TABLET | ORAL | 0 refills | Status: DC

## 2018-03-22 NOTE — Telephone Encounter (Signed)
Requested Prescriptions  Pending Prescriptions Disp Refills  . metFORMIN (GLUCOPHAGE) 500 MG tablet 90 tablet 0    Sig: TAKE 1 TABLET BY MOUTH ONCE DAILY WITH BREAKFAST     Endocrinology:  Diabetes - Biguanides Failed - 03/22/2018 11:45 AM      Failed - Cr in normal range and within 360 days    Creatinine, Ser  Date Value Ref Range Status  11/23/2017 0.50 (L) 0.57 - 1.00 mg/dL Final         Passed - HBA1C is between 0 and 7.9 and within 180 days    Hemoglobin A1C  Date Value Ref Range Status  11/23/2017 6.5 (A) 4.0 - 5.6 % Final         Passed - eGFR in normal range and within 360 days    GFR calc Af Amer  Date Value Ref Range Status  11/23/2017 127 >59 mL/min/1.73 Final   GFR calc non Af Amer  Date Value Ref Range Status  11/23/2017 110 >59 mL/min/1.73 Final         Passed - Valid encounter within last 6 months    Recent Outpatient Visits          3 months ago Diabetes mellitus type 2, noninsulin dependent ([place])   Primary Care at [place], MD   6 months ago Newly diagnosed diabetes [facility])   Primary Care at [facility], Arlie Solomons, MD   7 months ago Dizziness   Primary Care at [place], MD   1 year ago Abnormal uterine bleeding   Primary Care at [place], [place], [place]      Future Appointments            In 1 month Forrest Moron, MD Primary Care at [place], [facility]

## 2018-03-22 NOTE — Telephone Encounter (Addendum)
error:[id number] ° °

## 2018-03-22 NOTE — Telephone Encounter (Signed)
Copied from CRM [phone]. Topic: General - Other >> Mar 22, 2018 11:20 AM Leafy Roobinson, Norma J wrote: Reason for CRM: pt daughter is calling and her pharm has called  md office per daughter to get a refill on metformin . Pt has an appt with dr Creta Levinstallings on 05-10-2018. [facility] [address]. Pt would like metformin #90

## 2018-05-10 ENCOUNTER — Ambulatory Visit: Payer: Self-pay | Admitting: Family Medicine

## 2018-05-10 ENCOUNTER — Other Ambulatory Visit: Payer: Self-pay

## 2018-05-10 DIAGNOSIS — E119 Type 2 diabetes mellitus without complications: Secondary | ICD-10-CM

## 2018-05-10 DIAGNOSIS — E785 Hyperlipidemia, unspecified: Secondary | ICD-10-CM

## 2018-05-10 DIAGNOSIS — I10 Essential (primary) hypertension: Secondary | ICD-10-CM

## 2018-05-10 MED ORDER — ATORVASTATIN CALCIUM 80 MG PO TABS: 80.0000 mg | ORAL_TABLET | Freq: Every day | ORAL | 0 refills | Status: DC

## 2018-05-10 MED ORDER — LISINOPRIL 10 MG PO TABS: 10.0000 mg | ORAL_TABLET | Freq: Every day | ORAL | 0 refills | Status: DC

## 2018-05-10 MED ORDER — METFORMIN HCL 500 MG PO TABS: ORAL_TABLET | ORAL | 0 refills | Status: DC

## 2018-07-09 ENCOUNTER — Other Ambulatory Visit: Payer: Self-pay

## 2018-07-09 DIAGNOSIS — I10 Essential (primary) hypertension: Secondary | ICD-10-CM

## 2018-07-15 ENCOUNTER — Other Ambulatory Visit: Payer: Self-pay

## 2018-07-15 ENCOUNTER — Ambulatory Visit (INDEPENDENT_AMBULATORY_CARE_PROVIDER_SITE_OTHER): Payer: Self-pay | Admitting: Family Medicine

## 2018-07-15 DIAGNOSIS — E785 Hyperlipidemia, unspecified: Secondary | ICD-10-CM

## 2018-07-15 DIAGNOSIS — E119 Type 2 diabetes mellitus without complications: Secondary | ICD-10-CM

## 2018-07-15 DIAGNOSIS — I10 Essential (primary) hypertension: Secondary | ICD-10-CM

## 2018-07-15 NOTE — Progress Notes (Signed)
Telemedicine Encounter- SOAP NOTE Established Patient  This telephone encounter was conducted with the patient's (or proxy's) verbal consent via audio telecommunications: yes/no: Yes Patient was instructed to have this encounter in a suitably private space; and to only have persons present to whom they give permission to participate. In addition, patient identity was confirmed by use of name plus two identifiers (DOB and address).  I discussed the limitations, risks, security and privacy concerns of performing an evaluation and management service by telephone and the availability of in person appointments. I also discussed with the patient that there may be a patient responsible charge related to this service. The patient expressed understanding and agreed to proceed.  I spent a total of TIME; 0 MIN TO 60 MIN: 25 minutes talking with the patient or their proxy.  Chief Complaint  Patient presents with   f/u diabetes   Medication Refill    atorvastatin, lisinopril, megestrol, metformin and norethindrone    Subjective   Tracy Sanders is a 53 y.o. established patient. Telephone visit today for  HPI  Diabetes Mellitus Daughter is translating Patient reports that she has sugars that fluctuate from 130-250s.  They check it at any given time and does not check it often She needs med refills She just picked up some of her metformin from the store  She has not taken her meds or eating as yet this morning Patient is fasting this morning and her daughter checked her sugars 220 Lab Results  Component Value Date   HGBA1C 6.5 (A) 11/23/2017   Hypertension: Patient here for follow-up of elevated blood pressure. She is not exercising and is adherent to low salt diet.  Blood pressure is well controlled at home. Cardiac symptoms none. Patient denies chest pain, chest pressure/discomfort, dyspnea, exertional chest pressure/discomfort, fatigue, lower extremity edema and orthopnea.  Cardiovascular  risk factors: diabetes mellitus, dyslipidemia, hypertension, obesity (BMI >= 30 kg/m2) and smoking/ tobacco exposure. Use of agents associated with hypertension: none. History of target organ damage: none. BP Readings from Last 3 Encounters:  11/23/17 (!) 154/97  09/17/17 128/84  08/17/17 (!) 142/87     Patient Active Problem List   Diagnosis Date Noted   Newly diagnosed diabetes ([place]) 08/23/2017   Tobacco use disorder 08/23/2017   Perimenopausal 08/23/2017   Abnormal uterine bleeding 08/23/2017    Past Medical History:  Diagnosis Date   Hypercholesteremia    Hypertension     Current Outpatient Medications  Medication Sig Dispense Refill   atorvastatin (LIPITOR) 80 MG tablet Take 1 tablet (80 mg total) by mouth daily. 60 tablet 0   blood glucose meter kit and supplies Dispense based on patient and insurance preference. Use up to four times daily as directed. (FOR ICD-10 E10.9, E11.9). 1 each 0   Elastic Bandages & Supports (MEDICAL COMPRESSION STOCKINGS) MISC Wear daily to relieve pain from varicose veins. W09.811 2 each 1   glucose blood (IGLUCOSE TEST STRIPS) test strip Use as instructed 100 each 12   ibuprofen (ADVIL,MOTRIN) 600 MG tablet Take 1 tablet (600 mg total) by mouth every 8 (eight) hours as needed. 90 tablet 0   Lancet Devices (LANCING DEVICE) MISC Use to check blood glucose once daily and as needed 1 each 1   Lancets MISC Use to check blood glucose once daily and as needed. E11.9 100 each 6   lisinopril (PRINIVIL,ZESTRIL) 10 MG tablet Take 1 tablet (10 mg total) by mouth daily. 60 tablet 0   megestrol (MEGACE) 40 MG  tablet Take one tablet by mouth twice daily for 2 days, then 1 po daily for 1 week. 30 tablet 0   metFORMIN (GLUCOPHAGE) 500 MG tablet TAKE 1 TABLET BY MOUTH ONCE DAILY WITH BREAKFAST 60 tablet 0   norethindrone (AYGESTIN) 5 MG tablet Take 1 tablet (5 mg total) by mouth daily. 30 tablet 2   No current facility-administered medications for  this visit.     Allergies  Allergen Reactions   Other     "a cold medicine"  - not sure which one    Social History   Socioeconomic History   Marital status: Married    Spouse name: Not on file   Number of children: Not on file   Years of education: Not on file   Highest education level: Not on file  Occupational History   Not on file  Social Needs   Financial resource strain: Not on file   Food insecurity:    Worry: Not on file    Inability: Not on file   Transportation needs:    Medical: Not on file    Non-medical: Not on file  Tobacco Use   Smoking status: Current Every Day Smoker    Packs/day: 0.50   Smokeless tobacco: Never Used  Substance and Sexual Activity   Alcohol use: No   Drug use: No   Sexual activity: Yes  Lifestyle   Physical activity:    Days per week: Not on file    Minutes per session: Not on file   Stress: Not on file  Relationships   Social connections:    Talks on phone: Not on file    Gets together: Not on file    Attends religious service: Not on file    Active member of club or organization: Not on file    Attends meetings of clubs or organizations: Not on file    Relationship status: Not on file   Intimate partner violence:    Fear of current or ex partner: Not on file    Emotionally abused: Not on file    Physically abused: Not on file    Forced sexual activity: Not on file  Other Topics Concern   Not on file  Social History Narrative   Not on file    ROS Review of Systems  Constitutional: Negative for activity change, appetite change, chills and fever.  HENT: Negative for congestion, nosebleeds, trouble swallowing and voice change.   Respiratory: Negative for cough, shortness of breath and wheezing.   Gastrointestinal: Negative for diarrhea, nausea and vomiting.  Genitourinary: Negative for difficulty urinating, dysuria, flank pain and hematuria.  Musculoskeletal: Negative for back pain, joint swelling  and neck pain.  Neurological: Negative for dizziness, speech difficulty, light-headedness and numbness.  See HPI. All other review of systems negative.   Objective   Vitals as reported by the patient: There were no vitals filed for this visit.  Tracy Sanders was seen today for f/u diabetes and medication refill.  Diagnoses and all orders for this visit:  Diabetes mellitus type 2, noninsulin dependent ([place])-  Lab Results  Component Value Date   HGBA1C 6.5 (A) 11/23/2017   well controlled hemoglobin a1c is at goal Continue exercise Lipids monitored and renal function in range On metformin On ace or arb On asa 47m Reviewed diabetic foot care Emphasized importance of eye and dental exam    -     CMP14+EGFR; Future -     Hemoglobin A1c; Future -  Lipid panel; Future  Dyslipidemia- discussed lipid control as well as lifestyle modification -     CMP14+EGFR; Future -     Hemoglobin A1c; Future -     Lipid panel; Future  Essential hypertension- discussed med compliance as well as salt restriction as a part of the DASH diet  Will monitor renal function and comorbidities -     CMP14+EGFR; Future -     Hemoglobin A1c; Future -     Lipid panel; Future     I discussed the assessment and treatment plan with the patient. The patient was provided an opportunity to ask questions and all were answered. The patient agreed with the plan and demonstrated an understanding of the instructions.   The patient was advised to call back or seek an in-person evaluation if the symptoms worsen or if the condition fails to improve as anticipated.  I provided 25 minutes of non-face-to-face time during this encounter.  Forrest Moron, MD  Primary Care at [facility]

## 2018-07-15 NOTE — Patient Instructions (Signed)
° ° ° °  If you have lab work done today you will be contacted with your lab results within the next 2 weeks.  If you have not heard from us then please contact us. The fastest way to get your results is to register for My Chart. ° ° °IF you received an x-ray today, you will receive an invoice from [facility]. Please contact [facility] at [phone] with questions or concerns regarding your invoice.  ° °IF you received labwork today, you will receive an invoice from [facility]. Please contact [facility] at [phone] with questions or concerns regarding your invoice.  ° °Our billing staff will not be able to assist you with questions regarding bills from these companies. ° °You will be contacted with the lab results as soon as they are available. The fastest way to get your results is to activate your My Chart account. Instructions are located on the last page of this paperwork. If you have not heard from us regarding the results in 2 weeks, please contact this office. °  ° ° ° °

## 2018-08-12 ENCOUNTER — Other Ambulatory Visit: Payer: Self-pay | Admitting: Family Medicine

## 2018-08-12 DIAGNOSIS — E119 Type 2 diabetes mellitus without complications: Secondary | ICD-10-CM

## 2018-10-28 ENCOUNTER — Other Ambulatory Visit: Payer: Self-pay | Admitting: Family Medicine

## 2018-10-28 DIAGNOSIS — E785 Hyperlipidemia, unspecified: Secondary | ICD-10-CM

## 2018-11-22 ENCOUNTER — Other Ambulatory Visit: Payer: Self-pay | Admitting: Family Medicine

## 2018-11-22 DIAGNOSIS — E119 Type 2 diabetes mellitus without complications: Secondary | ICD-10-CM

## 2018-12-01 ENCOUNTER — Encounter: Payer: Self-pay | Admitting: Gynecology

## 2019-01-31 ENCOUNTER — Other Ambulatory Visit: Payer: Self-pay | Admitting: Family Medicine

## 2019-01-31 DIAGNOSIS — E785 Hyperlipidemia, unspecified: Secondary | ICD-10-CM

## 2019-01-31 DIAGNOSIS — E119 Type 2 diabetes mellitus without complications: Secondary | ICD-10-CM

## 2019-02-01 NOTE — Telephone Encounter (Signed)
Requested Prescriptions  Pending Prescriptions Disp Refills  . metFORMIN (GLUCOPHAGE) 500 MG tablet [Pharmacy Med Name: metFORMIN HCl 500 MG Oral Tablet] 60 tablet 0    Sig: Take 1 tablet by mouth once daily with breakfast     Endocrinology:  Diabetes - Biguanides Failed - 01/31/2019  5:50 PM      Failed - Cr in normal range and within 360 days    Creatinine, Ser  Date Value Ref Range Status  11/23/2017 0.50 (L) 0.57 - 1.00 mg/dL Final         Failed - HBA1C is between 0 and 7.9 and within 180 days    Hemoglobin A1C  Date Value Ref Range Status  11/23/2017 6.5 (A) 4.0 - 5.6 % Final         Failed - eGFR in normal range and within 360 days    GFR calc Af Amer  Date Value Ref Range Status  11/23/2017 127 >59 mL/min/1.73 Final   GFR calc non Af Amer  Date Value Ref Range Status  11/23/2017 110 >59 mL/min/1.73 Final         Failed - Valid encounter within last 6 months    Recent Outpatient Visits          6 months ago Diabetes mellitus type 2, noninsulin dependent ([place])   Primary Care at [facility], Zoe A, MD   1 year ago Diabetes mellitus type 2, noninsulin dependent ([place])   Primary Care at [facility], Arlie Solomons, MD   1 year ago Newly diagnosed diabetes [facility])   Primary Care at [facility], Arlie Solomons, MD   1 year ago Dizziness   Primary Care at [facility] [place], [place]   2 years ago Abnormal uterine bleeding   Primary Care at [place], [place], [place]             . atorvastatin (LIPITOR) 80 MG tablet [Pharmacy Med Name: Atorvastatin Calcium 80 MG Oral Tablet] 90 tablet 0    Sig: Take 1 tablet by mouth once daily     Cardiovascular:  Antilipid - Statins Failed - 01/31/2019  5:50 PM      Failed - Total Cholesterol in normal range and within 360 days    Cholesterol, Total  Date Value Ref Range Status  11/23/2017 128 100 - 199 mg/dL Final         Failed - LDL in normal range and within 360 days    LDL Calculated  Date Value Ref Range Status   11/23/2017 57 0 - 99 mg/dL Final         Failed - HDL in normal range and within 360 days    HDL  Date Value Ref Range Status  11/23/2017 50 >39 mg/dL Final         Failed - Triglycerides in normal range and within 360 days    Triglycerides  Date Value Ref Range Status  11/23/2017 104 0 - 149 mg/dL Final         Passed - Patient is not pregnant      Passed - Valid encounter within last 12 months    Recent Outpatient Visits          6 months ago Diabetes mellitus type 2, noninsulin dependent ([place])   Primary Care at [facility], Zoe A, [place]   1 year ago Diabetes mellitus type 2, noninsulin dependent ([place])   Primary Care at [place], Arlie Solomons, MD   1 year ago Newly  diagnosed diabetes [facility])   Primary Care at [facility], Arlie Solomons, MD   1 year ago Dizziness   Primary Care at [facility], Arlie Solomons, MD   2 years ago Abnormal uterine bleeding   Primary Care at [place], [place], [place]

## 2019-02-01 NOTE — Telephone Encounter (Signed)
Requested medication (s) are due for refill today :yes  Requested medication (s) are on the active medication list: yes  Last refill:  11/22/2018  Future visit scheduled: no  Notes to clinic: no valid encounter in last 6 months    Requested Prescriptions  Pending Prescriptions Disp Refills   metFORMIN (GLUCOPHAGE) 500 MG tablet [Pharmacy Med Name: metFORMIN HCl 500 MG Oral Tablet] 60 tablet 0    Sig: Take 1 tablet by mouth once daily with breakfast     Endocrinology:  Diabetes - Biguanides Failed - 01/31/2019  5:50 PM      Failed - Cr in normal range and within 360 days    Creatinine, Ser  Date Value Ref Range Status  11/23/2017 0.50 (L) 0.57 - 1.00 mg/dL Final         Failed - HBA1C is between 0 and 7.9 and within 180 days    Hemoglobin A1C  Date Value Ref Range Status  11/23/2017 6.5 (A) 4.0 - 5.6 % Final         Failed - eGFR in normal range and within 360 days    GFR calc Af Amer  Date Value Ref Range Status  11/23/2017 127 >59 mL/min/1.73 Final   GFR calc non Af Amer  Date Value Ref Range Status  11/23/2017 110 >59 mL/min/1.73 Final         Failed - Valid encounter within last 6 months    Recent Outpatient Visits          6 months ago Diabetes mellitus type 2, noninsulin dependent ([place])   Primary Care at [facility], Zoe A, [place]   1 year ago Diabetes mellitus type 2, noninsulin dependent ([place])   Primary Care at [facility], Arlie Solomons, MD   1 year ago Newly diagnosed diabetes [facility])   Primary Care at [facility], Arlie Solomons, MD   1 year ago Dizziness   Primary Care at [place], [place]   2 years ago Abnormal uterine bleeding   Primary Care at [place], [place], [place]             Signed Prescriptions Disp Refills   atorvastatin (LIPITOR) 80 MG tablet 90 tablet 0    Sig: Take 1 tablet by mouth once daily     Cardiovascular:  Antilipid - Statins Failed - 01/31/2019  5:50 PM      Failed - Total Cholesterol in normal range and within  360 days    Cholesterol, Total  Date Value Ref Range Status  11/23/2017 128 100 - 199 mg/dL Final         Failed - LDL in normal range and within 360 days    LDL Calculated  Date Value Ref Range Status  11/23/2017 57 0 - 99 mg/dL Final         Failed - HDL in normal range and within 360 days    HDL  Date Value Ref Range Status  11/23/2017 50 >39 mg/dL Final         Failed - Triglycerides in normal range and within 360 days    Triglycerides  Date Value Ref Range Status  11/23/2017 104 0 - 149 mg/dL Final         Passed - Patient is not pregnant      Passed - Valid encounter within last 12 months    Recent Outpatient Visits          6 months ago Diabetes mellitus type 2, noninsulin  dependent [facility])   Primary Care at [facility], Zoe A, [place]   1 year ago Diabetes mellitus type 2, noninsulin dependent [facility])   Primary Care at [facility], Arlie Solomons, MD   1 year ago Newly diagnosed diabetes [facility])   Primary Care at [facility], Arlie Solomons, MD   1 year ago Dizziness   Primary Care at [place], MD   2 years ago Abnormal uterine bleeding   Primary Care at [place], [place], [place]

## 2019-04-10 ENCOUNTER — Other Ambulatory Visit: Payer: Self-pay | Admitting: Family Medicine

## 2019-04-10 DIAGNOSIS — E119 Type 2 diabetes mellitus without complications: Secondary | ICD-10-CM

## 2019-04-10 NOTE — Telephone Encounter (Signed)
Requested medication (s) are due for refill today: no  Requested medication (s) are on the active medication list: yes  Last refill:  11/22/2018  Future visit scheduled: no  Notes to clinic:  no valid encounter within last 6 months   Requested Prescriptions  Pending Prescriptions Disp Refills   metFORMIN (GLUCOPHAGE) 500 MG tablet [Pharmacy Med Name: metFORMIN HCl 500 MG Oral Tablet] 60 tablet 0    Sig: Take 1 tablet by mouth once daily with breakfast      Endocrinology:  Diabetes - Biguanides Failed - 04/10/2019  4:56 PM      Failed - Cr in normal range and within 360 days    Creatinine, Ser  Date Value Ref Range Status  11/23/2017 0.50 (L) 0.57 - 1.00 mg/dL Final          Failed - HBA1C is between 0 and 7.9 and within 180 days    Hemoglobin A1C  Date Value Ref Range Status  11/23/2017 6.5 (A) 4.0 - 5.6 % Final          Failed - eGFR in normal range and within 360 days    GFR calc Af Amer  Date Value Ref Range Status  11/23/2017 127 >59 mL/min/1.73 Final   GFR calc non Af Amer  Date Value Ref Range Status  11/23/2017 110 >59 mL/min/1.73 Final          Failed - Valid encounter within last 6 months    Recent Outpatient Visits           8 months ago Diabetes mellitus type 2, noninsulin dependent ([place])   Primary Care at [facility], Zoe A, MD   1 year ago Diabetes mellitus type 2, noninsulin dependent ([place])   Primary Care at [facility], Arlie Solomons, MD   1 year ago Newly diagnosed diabetes [facility])   Primary Care at [facility], Arlie Solomons, MD   1 year ago Dizziness   Primary Care at [facility], Arlie Solomons, MD   2 years ago Abnormal uterine bleeding   Primary Care at [place], [place], [place]

## 2019-04-11 NOTE — Telephone Encounter (Signed)
No further refill without office visit °

## 2019-05-10 ENCOUNTER — Other Ambulatory Visit: Payer: Self-pay | Admitting: Family Medicine

## 2019-05-10 DIAGNOSIS — E119 Type 2 diabetes mellitus without complications: Secondary | ICD-10-CM

## 2019-05-10 DIAGNOSIS — E785 Hyperlipidemia, unspecified: Secondary | ICD-10-CM

## 2019-05-10 NOTE — Telephone Encounter (Signed)
Pt given courtesy refill- no further refill must keep upcoming appt Requested Prescriptions  Pending Prescriptions Disp Refills  . atorvastatin (LIPITOR) 80 MG tablet [Pharmacy Med Name: Atorvastatin Calcium 80 MG Oral Tablet] 90 tablet 0    Sig: Take 1 tablet by mouth once daily     Cardiovascular:  Antilipid - Statins Failed - 05/10/2019  3:57 PM      Failed - Total Cholesterol in normal range and within 360 days    Cholesterol, Total  Date Value Ref Range Status  11/23/2017 128 100 - 199 mg/dL Final         Failed - LDL in normal range and within 360 days    LDL Calculated  Date Value Ref Range Status  11/23/2017 57 0 - 99 mg/dL Final         Failed - HDL in normal range and within 360 days    HDL  Date Value Ref Range Status  11/23/2017 50 >39 mg/dL Final         Failed - Triglycerides in normal range and within 360 days    Triglycerides  Date Value Ref Range Status  11/23/2017 104 0 - 149 mg/dL Final         Passed - Patient is not pregnant      Passed - Valid encounter within last 12 months    Recent Outpatient Visits          9 months ago Diabetes mellitus type 2, noninsulin dependent ([place])   Primary Care at [facility], Zoe A, MD   1 year ago Diabetes mellitus type 2, noninsulin dependent ([place])   Primary Care at [facility], Arlie Solomons, MD   1 year ago Newly diagnosed diabetes [facility])   Primary Care at [facility], Arlie Solomons, MD   1 year ago Dizziness   Primary Care at [facility], [place] A, MD   2 years ago Abnormal uterine bleeding   Primary Care at [place], [place], [place]      Future Appointments            In 1 month Pamella Pert, Lilia Argue, MD Primary Care at [place], [facility]           . metFORMIN (GLUCOPHAGE) 500 MG tablet [Pharmacy Med Name: metFORMIN HCl 500 MG Oral Tablet] 30 tablet 0    Sig: Take 1 tablet by mouth once daily with breakfast     Endocrinology:  Diabetes - Biguanides Failed - 05/10/2019  3:57 PM      Failed - Cr in normal  range and within 360 days    Creatinine, Ser  Date Value Ref Range Status  11/23/2017 0.50 (L) 0.57 - 1.00 mg/dL Final         Failed - HBA1C is between 0 and 7.9 and within 180 days    Hemoglobin A1C  Date Value Ref Range Status  11/23/2017 6.5 (A) 4.0 - 5.6 % Final         Failed - eGFR in normal range and within 360 days    GFR calc Af Amer  Date Value Ref Range Status  11/23/2017 127 >59 mL/min/1.73 Final   GFR calc non Af Amer  Date Value Ref Range Status  11/23/2017 110 >59 mL/min/1.73 Final         Failed - Valid encounter within last 6 months    Recent Outpatient Visits          9 months ago Diabetes mellitus type 2, noninsulin dependent (  [place])   Primary Care at [facility], Zoe A, MD   1 year ago Diabetes mellitus type 2, noninsulin dependent [facility])   Primary Care at [facility], Arlie Solomons, MD   1 year ago Newly diagnosed diabetes [facility])   Primary Care at [facility], Arlie Solomons, MD   1 year ago Dizziness   Primary Care at [facility], [place], MD   2 years ago Abnormal uterine bleeding   Primary Care at [place] [place], [place]      Future Appointments            In 1 month Pamella Pert, Lilia Argue, MD Primary Care at [place], [facility]

## 2019-06-14 ENCOUNTER — Other Ambulatory Visit: Payer: Self-pay

## 2019-06-14 ENCOUNTER — Ambulatory Visit (INDEPENDENT_AMBULATORY_CARE_PROVIDER_SITE_OTHER): Payer: Self-pay | Admitting: Family Medicine

## 2019-06-14 ENCOUNTER — Encounter: Payer: Self-pay | Admitting: Family Medicine

## 2019-06-14 VITALS — BP 134/85 | HR 106 | Temp 97.3°F | Ht 58.7 in | Wt 178.0 lb

## 2019-06-14 DIAGNOSIS — E1142 Type 2 diabetes mellitus with diabetic polyneuropathy: Secondary | ICD-10-CM

## 2019-06-14 DIAGNOSIS — Z124 Encounter for screening for malignant neoplasm of cervix: Secondary | ICD-10-CM

## 2019-06-14 DIAGNOSIS — Z1211 Encounter for screening for malignant neoplasm of colon: Secondary | ICD-10-CM

## 2019-06-14 DIAGNOSIS — Z0001 Encounter for general adult medical examination with abnormal findings: Secondary | ICD-10-CM

## 2019-06-14 DIAGNOSIS — I10 Essential (primary) hypertension: Secondary | ICD-10-CM

## 2019-06-14 DIAGNOSIS — M549 Dorsalgia, unspecified: Secondary | ICD-10-CM

## 2019-06-14 DIAGNOSIS — E1169 Type 2 diabetes mellitus with other specified complication: Secondary | ICD-10-CM

## 2019-06-14 DIAGNOSIS — E785 Hyperlipidemia, unspecified: Secondary | ICD-10-CM

## 2019-06-14 DIAGNOSIS — Z0000 Encounter for general adult medical examination without abnormal findings: Secondary | ICD-10-CM

## 2019-06-14 DIAGNOSIS — Z1231 Encounter for screening mammogram for malignant neoplasm of breast: Secondary | ICD-10-CM

## 2019-06-14 MED ORDER — CYCLOBENZAPRINE HCL 10 MG PO TABS: 10.0000 mg | ORAL_TABLET | Freq: Three times a day (TID) | ORAL | 0 refills | Status: DC | PRN

## 2019-06-14 MED ORDER — METFORMIN HCL 500 MG PO TABS: 500.0000 mg | ORAL_TABLET | Freq: Every day | ORAL | 0 refills | Status: DC

## 2019-06-14 MED ORDER — ATORVASTATIN CALCIUM 80 MG PO TABS: 80.0000 mg | ORAL_TABLET | Freq: Every day | ORAL | 3 refills | Status: AC

## 2019-06-14 MED ORDER — LISINOPRIL 10 MG PO TABS: 10.0000 mg | ORAL_TABLET | Freq: Every day | ORAL | 0 refills | Status: DC

## 2019-06-14 NOTE — Patient Instructions (Addendum)
Breast and Cervical Cancer Program for [facility]: 1. [place], Coordinator, [phone] 2. [address] regional, Randel Pigg, [place], [phone]   Next pap due Oct 2021   Preventive Care 56-56 Years Old, Female Preventive care refers to visits with your health care provider and lifestyle choices that can promote health and wellness. This includes:  A yearly physical exam. This may also be called an annual well check.  Regular dental visits and eye exams.  Immunizations.  Screening for certain conditions.  Healthy lifestyle choices, such as eating a healthy diet, getting regular exercise, not using drugs or products that contain nicotine and tobacco, and limiting alcohol use. What can I expect for my preventive care visit? Physical exam Your health care provider will check your:  Height and weight. This may be used to calculate body mass index (BMI), which tells if you are at a healthy weight.  Heart rate and blood pressure.  Skin for abnormal spots. Counseling Your health care provider may ask you questions about your:  Alcohol, tobacco, and drug use.  Emotional well-being.  Home and relationship well-being.  Sexual activity.  Eating habits.  Work and work Statistician.  Method of birth control.  Menstrual cycle.  Pregnancy history. What immunizations do I need?  Influenza (flu) vaccine  This is recommended every year. Tetanus, diphtheria, and pertussis (Tdap) vaccine  You may need a Td booster every 10 years. Varicella (chickenpox) vaccine  You may need this if you have not been vaccinated. Zoster (shingles) vaccine  You may need this after age 58. Measles, mumps, and rubella (MMR) vaccine  You may need at least one dose of MMR if you were born in 1957 or later. You may also need a second dose. Pneumococcal conjugate (PCV13) vaccine  You may need this if you have certain conditions and were not previously  vaccinated. Pneumococcal polysaccharide (PPSV23) vaccine  You may need one or two doses if you smoke cigarettes or if you have certain conditions. Meningococcal conjugate (MenACWY) vaccine  You may need this if you have certain conditions. Hepatitis A vaccine  You may need this if you have certain conditions or if you travel or work in places where you may be exposed to hepatitis A. Hepatitis B vaccine  You may need this if you have certain conditions or if you travel or work in places where you may be exposed to hepatitis B. Haemophilus influenzae type b (Hib) vaccine  You may need this if you have certain conditions. Human papillomavirus (HPV) vaccine  If recommended by your health care provider, you may need three doses over 6 months. You may receive vaccines as individual doses or as more than one vaccine together in one shot (combination vaccines). Talk with your health care provider about the risks and benefits of combination vaccines. What tests do I need? Blood tests  Lipid and cholesterol levels. These may be checked every 5 years, or more frequently if you are over 30 years old.  Hepatitis C test.  Hepatitis B test. Screening  Lung cancer screening. You may have this screening every year starting at age 50 if you have a 30-pack-year history of smoking and currently smoke or have quit within the past 15 years.  Colorectal cancer screening. All adults should have this screening starting at age 7 and continuing until age 19. Your health care provider may recommend screening at age 44 if you are at increased risk. You will have tests every 1-10 years, depending on your  results and the type of screening test.  Diabetes screening. This is done by checking your blood sugar (glucose) after you have not eaten for a while (fasting). You may have this done every 1-3 years.  Mammogram. This may be done every 1-2 years. Talk with your health care provider about when you should start  having regular mammograms. This may depend on whether you have a family history of breast cancer.  BRCA-related cancer screening. This may be done if you have a family history of breast, ovarian, tubal, or peritoneal cancers.  Pelvic exam and Pap test. This may be done every 3 years starting at age 28. Starting at age 49, this may be done every 5 years if you have a Pap test in combination with an HPV test. Other tests  Sexually transmitted disease (STD) testing.  Bone density scan. This is done to screen for osteoporosis. You may have this scan if you are at high risk for osteoporosis. Follow these instructions at home: Eating and drinking  Eat a diet that includes fresh fruits and vegetables, whole grains, lean protein, and low-fat dairy.  Take vitamin and mineral supplements as recommended by your health care provider.  Do not drink alcohol if: ? Your health care provider tells you not to drink. ? You are pregnant, may be pregnant, or are planning to become pregnant.  If you drink alcohol: ? Limit how much you have to 0-1 drink a day. ? Be aware of how much alcohol is in your drink. In the [place] one drink equals one 12 oz bottle of beer (355 mL), one 5 oz glass of wine (148 mL), or one 1 oz glass of hard liquor (44 mL). Lifestyle  Take daily care of your teeth and gums.  Stay active. Exercise for at least 30 minutes on 5 or more days each week.  Do not use any products that contain nicotine or tobacco, such as cigarettes, e-cigarettes, and chewing tobacco. If you need help quitting, ask your health care provider.  If you are sexually active, practice safe sex. Use a condom or other form of birth control (contraception) in order to prevent pregnancy and STIs (sexually transmitted infections).  If told by your health care provider, take low-dose aspirin daily starting at age 81. What's next?  Visit your health care provider once a year for a well check visit.  Ask your health  care provider how often you should have your eyes and teeth checked.  Stay up to date on all vaccines. This information is not intended to replace advice given to you by your health care provider. Make sure you discuss any questions you have with your health care provider. Document Revised: 11/05/2017 Document Reviewed: 11/05/2017 Elsevier Patient Education  [employer].   If you have lab work done today you will be contacted with your lab results within the next 2 weeks.  If you have not heard from [place] then please contact us. The fastest way to get your results is to register for My Chart.   IF you received an x-ray today, you will receive an invoice from [facility]. Please contact [facility] at [phone] with questions or concerns regarding your invoice.   IF you received labwork today, you will receive an invoice from [place]. Please contact [facility] at [phone] with questions or concerns regarding your invoice.   Our billing staff will not be able to assist you with questions regarding bills from these companies.  You will be contacted  with the lab results as soon as they are available. The fastest way to get your results is to activate your My Chart account. Instructions are located on the last page of this paperwork. If you have not heard from [place] regarding the results in 2 weeks, please contact this office.

## 2019-06-14 NOTE — Progress Notes (Signed)
4/6/20212:11 PM  Tracy Sanders [birth year], 54 y.o., female [id number]  Chief Complaint  Patient presents with  . Annual Exam  . Back Pain    EOD pain 33/54 with certain movements    HPI:   Patient is a 54 y.o. female with past medical history significant for DM2, HLP and HTN who presents today for CPE  Cervical Cancer Screening: Oct 2018, negative Breast Cancer Screening: due Colorectal Cancer Screening: due Bone Density Testing: at age 89 HIV Screening: completed HCV screening: completed STI Screening: 2018 Seasonal Influenza Vaccination: UTD Td/Tdap Vaccination: due 2029 Pneumococcal Vaccination: UTD Zoster Vaccination: declines Frequency of Dental evaluation: has no concerns Frequency of Eye evaluation: needs referral  Daughter serves as interpreter per patient's preference  Depression screen [facility] 2/9 06/14/2019 07/15/2018 07/15/2018  Decreased Interest 0 0 0  Down, Depressed, Hopeless 0 0 0  PHQ - 2 Score 0 0 0    Fall Risk  06/14/2019 07/15/2018 07/15/2018 11/23/2017 09/17/2017  Falls in the past year? 0 0 0 No No  Number falls in past yr: 0 0 0 - -  Injury with Fall? 0 0 0 - -  Follow up Falls evaluation completed Falls evaluation completed Falls evaluation completed - -     Allergies  Allergen Reactions  . Other     "a cold medicine"  - not sure which one    Prior to Admission medications   Medication Sig Start Date End Date Taking? Authorizing Provider  atorvastatin (LIPITOR) 80 MG tablet Take 1 tablet by mouth once daily 05/10/19  Yes Stallings, Zoe A, MD  blood glucose meter kit and supplies Dispense based on patient and insurance preference. Use up to four times daily as directed. (FOR ICD-10 E10.9, E11.9). 09/17/17  Yes Stallings, Zoe A, MD  glucose blood (IGLUCOSE TEST STRIPS) test strip Use as instructed 09/17/17  Yes Forrest Moron, MD  Lancet Devices (LANCING DEVICE) MISC Use to check blood glucose once daily and as needed 09/17/17  Yes Delia Chimes A, MD    Lancets MISC Use to check blood glucose once daily and as needed. E11.9 09/17/17  Yes Forrest Moron, MD  metFORMIN (GLUCOPHAGE) 500 MG tablet Take 1 tablet by mouth once daily with breakfast 05/10/19  Yes Forrest Moron, MD  Elastic Bandages & Supports ([place]) [place] Wear daily to relieve pain from varicose veins. T62.563 Patient not taking: Reported on 06/14/2019 09/17/17   Forrest Moron, MD  ibuprofen (ADVIL,MOTRIN) 600 MG tablet Take 1 tablet (600 mg total) by mouth every 8 (eight) hours as needed. Patient not taking: Reported on 06/14/2019 09/17/17   Forrest Moron, MD  lisinopril (PRINIVIL,ZESTRIL) 10 MG tablet Take 1 tablet (10 mg total) by mouth daily. Patient not taking: Reported on 06/14/2019 05/10/18   Forrest Moron, MD  megestrol (MEGACE) 40 MG tablet Take one tablet by mouth twice daily for 2 days, then 1 po daily for 1 week. Patient not taking: Reported on 06/14/2019 02/04/17   Fontaine, Belinda Block, MD  norethindrone (AYGESTIN) 5 MG tablet Take 1 tablet (5 mg total) by mouth daily. Patient not taking: Reported on 06/14/2019 03/23/17   Fontaine, Belinda Block, MD    Past Medical History:  Diagnosis Date  . Diabetes mellitus without complication ([place])   . Hypercholesteremia   . Hypertension     Past Surgical History:  Procedure Laterality Date  . APPENDECTOMY      Social History   Tobacco Use  .  Smoking status: Current Every Day Smoker    Packs/day: 0.50  . Smokeless tobacco: Never Used  Substance Use Topics  . Alcohol use: No    Family History  Problem Relation Age of Onset  . Stroke Mother   . Hypertension Sister   . Diabetes Sister   . Hypertension Brother   . Diabetes Brother     Review of Systems  Constitutional: Negative for chills and fever.  Eyes: Negative for blurred vision and double vision.  Respiratory: Negative for cough and shortness of breath.   Cardiovascular: Negative for chest pain, palpitations and leg swelling.   Gastrointestinal: Positive for constipation (sporadic). Negative for abdominal pain, blood in stool, diarrhea, melena, nausea and vomiting.  Genitourinary: Negative for frequency and urgency.  Musculoskeletal: Positive for back pain (right mid back, "catches" triggered by certain movements, resolves with in minutes,).  Neurological: Positive for tingling (soles of feet, worse at night). Negative for dizziness and headaches.  Endo/Heme/Allergies: Negative for polydipsia.  Psychiatric/Behavioral: Negative for depression. The patient is not nervous/anxious and does not have insomnia.   All other systems reviewed and are negative.    OBJECTIVE:  Today's Vitals   06/14/19 1351  BP: 134/85  Pulse: (!) 106  Temp: (!) 97.3 F (36.3 C)  SpO2: 95%  Weight: 178 lb (80.7 kg)  Height: 4' 10.7" (1.491 m)   Body mass index is 36.32 kg/m.   Hearing Screening   '125Hz'  '250Hz'  '500Hz'  '1000Hz'  '2000Hz'  '3000Hz'  '4000Hz'  '6000Hz'  '8000Hz'   Right ear:           Left ear:             Visual Acuity Screening   Right eye Left eye Both eyes  Without correction: 2040 2040 2020  With correction:       Physical Exam Vitals and nursing note reviewed.  Constitutional:      Appearance: She is well-developed.  HENT:     Head: Normocephalic and atraumatic.     Right Ear: Hearing, tympanic membrane, ear canal and external ear normal.     Left Ear: Hearing, tympanic membrane, ear canal and external ear normal.     Mouth/Throat:     Mouth: Mucous membranes are moist.     Pharynx: No oropharyngeal exudate or posterior oropharyngeal erythema.  Eyes:     Extraocular Movements: Extraocular movements intact.     Conjunctiva/sclera: Conjunctivae normal.     Pupils: Pupils are equal, round, and reactive to light.  Neck:     Thyroid: No thyromegaly.  Cardiovascular:     Rate and Rhythm: Normal rate and regular rhythm.     Heart sounds: Normal heart sounds. No murmur. No friction rub. No gallop.   Pulmonary:      Effort: Pulmonary effort is normal.     Breath sounds: Normal breath sounds. No wheezing, rhonchi or rales.  Abdominal:     General: Bowel sounds are normal. There is no distension.     Palpations: Abdomen is soft. There is no hepatomegaly, splenomegaly or mass.     Tenderness: There is no abdominal tenderness.  Musculoskeletal:        General: Normal range of motion.     Cervical back: Neck supple.     Thoracic back: Spasms present. No bony tenderness.     Right lower leg: No edema.     Left lower leg: No edema.  Lymphadenopathy:     Cervical: No cervical adenopathy.  Skin:    General: Skin is warm  and dry.  Neurological:     Mental Status: She is alert and oriented to person, place, and time.     Cranial Nerves: No cranial nerve deficit.     Gait: Gait normal.     Deep Tendon Reflexes: Reflexes are normal and symmetric.  Psychiatric:        Mood and Affect: Mood normal.        Behavior: Behavior normal.      Diabetic Foot Form - Detailed   Diabetic Foot Exam - detailed Diabetic Foot exam was performed with the following findings: Yes 06/14/2019  2:11 PM  Visual Foot Exam completed.: Yes  Pulse Foot Exam completed.: Yes  Sensory Foot Exam Completed.: Yes Semmes-Weinstein Monofilament Test   Comments: Pt reports bilateral diminished sensitivity to touch w/ monofilament testing at both great toes and bottom of feet towards the front of the foot     No results found for this or any previous visit (from the past 24 hour(s)).  No results found.   ASSESSMENT and PLAN  1. Annual physical exam Routine HCM labs ordered. HCM reviewed/discussed. Anticipatory guidance regarding healthy weight, lifestyle and choices given.   2. Cervical cancer screening 3. Visit for screening mammogram - Ambulatory Referral to [facility]  4. Screen for colon cancer - IFOBT POC (occult bld, rslt in office); Future  5. DM type 2 with diabetic peripheral neuropathy ([place]) Checking labs today,  medications will be adjusted as needed.  - Lipid panel - CMP14+EGFR - Hemoglobin A1c - Ambulatory referral to Ophthalmology - Microalbumin/Creatinine Ratio, Urine - metFORMIN (GLUCOPHAGE) 500 MG tablet; Take 1 tablet (500 mg total) by mouth daily with breakfast.  6. Hyperlipidemia associated with type 2 diabetes mellitus ([place]) Checking labs today, medications will be adjusted as needed.  - Lipid panel - CMP14+EGFR - Hemoglobin A1c - atorvastatin (LIPITOR) 80 MG tablet; Take 1 tablet (80 mg total) by mouth daily.  7. Essential hypertension Controlled. Continue current regime.  - Lipid panel - CMP14+EGFR - Hemoglobin A1c - Comprehensive metabolic panel - lisinopril (ZESTRIL) 10 MG tablet; Take 1 tablet (10 mg total) by mouth daily.  8. Mid back pain on right side Discussed supportive measures, new meds r/se/b and RTC precautions. Patient educational handout given. - cyclobenzaprine (FLEXERIL) 10 MG tablet; Take 1 tablet (10 mg total) by mouth 3 (three) times daily as needed for muscle spasms.  Return in about 4 weeks (around 07/12/2019) for diabetes.    Rutherford Guys, MD Primary Care at [place], [place] [address] Ph.  [phone] Fax [phone]

## 2019-06-15 LAB — CMP14+EGFR
ALT: 61 IU/L — ABNORMAL HIGH (ref 0–32)
AST: 32 IU/L (ref 0–40)
Albumin/Globulin Ratio: 1.8 (ref 1.2–2.2)
Albumin: 4.4 g/dL (ref 3.8–4.9)
Alkaline Phosphatase: 103 IU/L (ref 39–117)
BUN/Creatinine Ratio: 18 (ref 9–23)
BUN: 12 mg/dL (ref 6–24)
Bilirubin Total: 0.4 mg/dL (ref 0.0–1.2)
CO2: 23 mmol/L (ref 20–29)
Calcium: 9.4 mg/dL (ref 8.7–10.2)
Chloride: 103 mmol/L (ref 96–106)
Creatinine, Ser: 0.66 mg/dL (ref 0.57–1.00)
GFR calc Af Amer: 115 mL/min/{1.73_m2} (ref >59)
GFR calc non Af Amer: 100 mL/min/{1.73_m2} (ref >59)
Globulin, Total: 2.5 g/dL (ref 1.5–4.5)
Glucose: 240 mg/dL — ABNORMAL HIGH (ref 65–99)
Potassium: 4.3 mmol/L (ref 3.5–5.2)
Sodium: 142 mmol/L (ref 134–144)
Total Protein: 6.9 g/dL (ref 6.0–8.5)

## 2019-06-15 LAB — HEMOGLOBIN A1C
Est. average glucose Bld gHb Est-mCnc: 177 mg/dL
Hgb A1c MFr Bld: 7.8 % — ABNORMAL HIGH (ref 4.8–5.6)

## 2019-06-15 LAB — LIPID PANEL
Chol/HDL Ratio: 3.7 ratio (ref 0.0–4.4)
Cholesterol, Total: 160 mg/dL (ref 100–199)
HDL: 43 mg/dL (ref >39)
LDL Chol Calc (NIH): 78 mg/dL (ref 0–99)
Triglycerides: 234 mg/dL — ABNORMAL HIGH (ref 0–149)
VLDL Cholesterol Cal: 39 mg/dL (ref 5–40)

## 2019-06-15 LAB — MICROALBUMIN / CREATININE URINE RATIO
Creatinine, Urine: 102.4 mg/dL
Microalb/Creat Ratio: 78 mg/g creat — ABNORMAL HIGH (ref 0–29)
Microalbumin, Urine: 79.8 ug/mL

## 2019-06-17 ENCOUNTER — Telehealth: Payer: Self-pay

## 2019-06-17 NOTE — Telephone Encounter (Signed)
Telephoned patient at home number using interpreter [phone]. Left a voice message with [facility] contact information.

## 2019-07-11 ENCOUNTER — Ambulatory Visit: Payer: Self-pay | Admitting: Family Medicine

## 2019-07-12 ENCOUNTER — Ambulatory Visit: Payer: 59 | Admitting: Adult Health Nurse Practitioner

## 2019-07-12 ENCOUNTER — Encounter: Payer: Self-pay | Admitting: Family Medicine

## 2019-07-12 LAB — HM DIABETES EYE EXAM

## 2019-07-13 ENCOUNTER — Encounter: Payer: Self-pay | Admitting: Family Medicine

## 2019-07-13 ENCOUNTER — Telehealth: Payer: Self-pay | Admitting: Adult Health Nurse Practitioner

## 2019-07-13 ENCOUNTER — Telehealth: Payer: Self-pay | Admitting: Family Medicine

## 2019-07-13 ENCOUNTER — Encounter: Payer: Self-pay | Admitting: Adult Health Nurse Practitioner

## 2019-07-13 ENCOUNTER — Ambulatory Visit: Payer: 59 | Admitting: Family Medicine

## 2019-07-13 ENCOUNTER — Ambulatory Visit: Payer: 59 | Admitting: Adult Health Nurse Practitioner

## 2019-07-13 ENCOUNTER — Other Ambulatory Visit: Payer: Self-pay

## 2019-07-13 DIAGNOSIS — E1142 Type 2 diabetes mellitus with diabetic polyneuropathy: Secondary | ICD-10-CM

## 2019-07-13 DIAGNOSIS — E1169 Type 2 diabetes mellitus with other specified complication: Secondary | ICD-10-CM

## 2019-07-13 DIAGNOSIS — E785 Hyperlipidemia, unspecified: Secondary | ICD-10-CM

## 2019-07-13 NOTE — Progress Notes (Unsigned)
Established Patient Office Visit  Subjective:  Patient ID: Tracy Sanders, female    DOB: 1965-04-16  Age: 54 y.o. MRN: [id number]  CC:  Chief Complaint  Patient presents with  . Diabetes    f/u on A1c    HPI Tracy Sanders presents for   Diabetes Mellitus: Patient presents for follow up of diabetes. Symptoms: dizzy feeling. Symptoms have stabilized. Patient denies increase appetite, paresthesia of the feet, polydipsia and polyuria.  Evaluation to date has been included: hemoglobin A1C.  Home sugars: patient does not check sugars. Treatment to date: Continued metformin which has been ineffective.  Lab Results  Component Value Date   HGBA1C 7.8 (H) 06/14/2019     Past Medical History:  Diagnosis Date  . Diabetes mellitus without complication ([place])   . Hypercholesteremia   . Hypertension     Past Surgical History:  Procedure Laterality Date  . APPENDECTOMY      Family History  Problem Relation Age of Onset  . Stroke Mother   . Hypertension Sister   . Diabetes Sister   . Hypertension Brother   . Diabetes Brother     Social History   Socioeconomic History  . Marital status: Married    Spouse name: Not on file  . Number of children: Not on file  . Years of education: Not on file  . Highest education level: Not on file  Occupational History  . Not on file  Tobacco Use  . Smoking status: Current Every Day Smoker    Packs/day: 0.50  . Smokeless tobacco: Never Used  Substance and Sexual Activity  . Alcohol use: No  . Drug use: No  . Sexual activity: Yes  Other Topics Concern  . Not on file  Social History Narrative  . Not on file   Social Determinants of Health   Financial Resource Strain:   . Difficulty of Paying Living Expenses:   Food Insecurity:   . Worried About Charity fundraiser in the Last Year:   . Arboriculturist in the Last Year:   Transportation Needs:   . Film/video editor (Medical):   Marland Kitchen Lack of Transportation (Non-Medical):     Physical Activity:   . Days of Exercise per Week:   . Minutes of Exercise per Session:   Stress:   . Feeling of Stress :   Social Connections:   . Frequency of Communication with Friends and Family:   . Frequency of Social Gatherings with Friends and Family:   . Attends Religious Services:   . Active Member of Clubs or Organizations:   . Attends Archivist Meetings:   Marland Kitchen Marital Status:   Intimate Partner Violence:   . Fear of Current or Ex-Partner:   . Emotionally Abused:   Marland Kitchen Physically Abused:   . Sexually Abused:     Outpatient Medications Prior to Visit  Medication Sig Dispense Refill  . atorvastatin (LIPITOR) 80 MG tablet Take 1 tablet (80 mg total) by mouth daily. 90 tablet 3  . blood glucose meter kit and supplies Dispense based on patient and insurance preference. Use up to four times daily as directed. (FOR ICD-10 E10.9, E11.9). 1 each 0  . cyclobenzaprine (FLEXERIL) 10 MG tablet Take 1 tablet (10 mg total) by mouth 3 (three) times daily as needed for muscle spasms. 30 tablet 0  . Elastic Bandages & Supports (MEDICAL COMPRESSION STOCKINGS) MISC Wear daily to relieve pain from varicose veins. X32.440 2 each 1  .  glucose blood (IGLUCOSE TEST STRIPS) test strip Use as instructed 100 each 12  . ibuprofen (ADVIL,MOTRIN) 600 MG tablet Take 1 tablet (600 mg total) by mouth every 8 (eight) hours as needed. 90 tablet 0  . Lancet Devices (LANCING DEVICE) MISC Use to check blood glucose once daily and as needed 1 each 1  . Lancets MISC Use to check blood glucose once daily and as needed. E11.9 100 each 6  . lisinopril (ZESTRIL) 10 MG tablet Take 1 tablet (10 mg total) by mouth daily. 90 tablet 0  . metFORMIN (GLUCOPHAGE) 500 MG tablet Take 1 tablet (500 mg total) by mouth daily with breakfast. 90 tablet 0   No facility-administered medications prior to visit.    Allergies  Allergen Reactions  . Other     "a cold medicine"  - not sure which one    ROS Review of  Systems Review of Systems  Constitutional: Negative for activity change, appetite change, chills and fever.  HENT: Negative for congestion, nosebleeds, trouble swallowing and voice change.   Respiratory: Negative for cough, shortness of breath and wheezing.   Gastrointestinal: Negative for diarrhea, nausea and vomiting.  Genitourinary: Negative for difficulty urinating, dysuria, flank pain and hematuria.  Musculoskeletal: Negative for back pain, joint swelling and neck pain.  Neurological: Negative for dizziness, speech difficulty, light-headedness and numbness.  See HPI. All other review of systems negative.     Objective:    Physical Exam  BP (!) 153/93 (BP Location: Right Arm, Patient Position: Sitting, Cuff Size: Large)   Pulse 84   Temp (!) 97.4 F (36.3 C) (Temporal)   Ht 4' 10.5" (1.486 m)   Wt 177 lb 6.4 oz (80.5 kg)   SpO2 98%   BMI 36.45 kg/m  Wt Readings from Last 3 Encounters:  07/13/19 177 lb 6.4 oz (80.5 kg)  06/14/19 178 lb (80.7 kg)  11/23/17 169 lb 9.6 oz (76.9 kg)   Physical Exam  Constitutional: Oriented to person, place, and time. Appears well-developed and well-nourished.  HENT:  Head: Normocephalic and atraumatic.  Eyes: Conjunctivae and EOM are normal.  Cardiovascular: Normal rate, regular rhythm, normal heart sounds and intact distal pulses.  No murmur heard. Pulmonary/Chest: Effort normal and breath sounds normal. No stridor. No respiratory distress. Has no wheezes.  Neurological: Is alert and oriented to person, place, and time.  Skin: Skin is warm. Capillary refill takes less than 2 seconds.  Psychiatric: Has a normal mood and affect. Behavior is normal. Judgment and thought content normal.    Health Maintenance Due  Topic Date Due  . OPHTHALMOLOGY EXAM  Never done    There are no preventive care reminders to display for this patient.  Lab Results  Component Value Date   TSH 5.130 (H) 12/22/2016   Lab Results  Component Value Date    WBC 5.3 08/17/2017   HGB 12.2 08/17/2017   HCT 38.5 08/17/2017   MCV 69.9 (A) 08/17/2017   PLT 224 03/23/2017   Lab Results  Component Value Date   NA 142 06/14/2019   K 4.3 06/14/2019   CO2 23 06/14/2019   GLUCOSE 240 (H) 06/14/2019   BUN 12 06/14/2019   CREATININE 0.66 06/14/2019   BILITOT 0.4 06/14/2019   ALKPHOS 103 06/14/2019   AST 32 06/14/2019   ALT 61 (H) 06/14/2019   PROT 6.9 06/14/2019   ALBUMIN 4.4 06/14/2019   CALCIUM 9.4 06/14/2019   Lab Results  Component Value Date   CHOL 160 06/14/2019  Lab Results  Component Value Date   HDL 43 06/14/2019   Lab Results  Component Value Date   LDLCALC 78 06/14/2019   Lab Results  Component Value Date   TRIG 234 (H) 06/14/2019   Lab Results  Component Value Date   CHOLHDL 3.7 06/14/2019   Lab Results  Component Value Date   HGBA1C 7.8 (H) 06/14/2019      Assessment & Plan:   Problem List Items Addressed This Visit      Endocrine   DM type 2 with diabetic peripheral neuropathy ([place])   Relevant Orders   POCT glycosylated hemoglobin (Hb A1C)   CMP14+EGFR   Lipid panel   Hyperlipidemia associated with type 2 diabetes mellitus ([place])     Discussed that she needs to track her sugars and return to talk to Dr. Pamella Pert about her readings Discussed that improving her diabetes will help her neuropathy Continue current meds  Return in July for repeat a1c  No orders of the defined types were placed in this encounter.   Follow-up: No follow-ups on file.    Forrest Moron, MD

## 2019-07-13 NOTE — Patient Instructions (Addendum)
   If you have lab work done today you will be contacted with your lab results within the next 2 weeks.  If you have not heard from us then please contact us. The fastest way to get your results is to register for My Chart.   IF you received an x-ray today, you will receive an invoice from [facility]. Please contact [facility] at [phone] with questions or concerns regarding your invoice.   IF you received labwork today, you will receive an invoice from [facility]. Please contact [facility] at [phone] with questions or concerns regarding your invoice.   Our billing staff will not be able to assist you with questions regarding bills from these companies.  You will be contacted with the lab results as soon as they are available. The fastest way to get your results is to activate your My Chart account. Instructions are located on the last page of this paperwork. If you have not heard from us regarding the results in 2 weeks, please contact this office.     Preventing Hypoglycemia Hypoglycemia occurs when the level of sugar (glucose) in the blood is too low. Hypoglycemia can happen in people who do or do not have diabetes (diabetes mellitus). It can develop quickly, and it can be a medical emergency. For most people with diabetes, a blood glucose level below 70 mg/dL (3.9 mmol/L) is considered hypoglycemia. Glucose is a type of sugar that provides the body's main source of energy. Certain hormones (insulin and glucagon) control the level of glucose in the blood. Insulin lowers blood glucose, and glucagon increases blood glucose. Hypoglycemia can result from having too much insulin in the bloodstream, or from not eating enough food that contains glucose. Your risk for hypoglycemia is higher:  If you take insulin or diabetes medicines to help lower your blood glucose or help your body make more insulin.  If you skip or delay a meal or snack.  If you are ill.  During  and after exercise. You can prevent hypoglycemia by working with your health care provider to adjust your meal plan as needed and by taking other precautions. How can hypoglycemia affect me? Mild symptoms Mild hypoglycemia may not cause any symptoms. If you do have symptoms, they may include:  Hunger.  Anxiety.  Sweating and feeling clammy.  Dizziness or feeling light-headed.  Sleepiness.  Nausea.  Increased heart rate.  Headache.  Blurry vision.  Irritability.  Tingling or numbness around the mouth, lips, or tongue.  A change in coordination.  Restless sleep. If mild hypoglycemia is not recognized and treated, it can quickly become moderate or severe hypoglycemia. Moderate symptoms Moderate hypoglycemia can cause:  Mental confusion and poor judgment.  Behavior changes.  Weakness.  Irregular heartbeat. Severe symptoms Severe hypoglycemia is a medical emergency. It can cause:  Fainting.  Seizures.  Loss of consciousness (coma).  Death. What nutrition changes can be made?  Work with your health care provider or diet and nutrition specialist (dietitian) to make a healthy meal plan that is right for you. Follow your meal plan carefully.  Eat meals at regular times.  If recommended by your health care provider, have snacks between meals.  Donot skip or delay meals or snacks. You can be at risk for hypoglycemia if you are not getting enough carbohydrates. What lifestyle changes can be made?   Work closely with your health care provider to manage your blood glucose. Make sure you know: ? Your goal blood glucose levels. ?   How and when to check your blood glucose. ? The symptoms of hypoglycemia. It is important to treat it right away to keep it from becoming severe.  Do not drink alcohol on an empty stomach.  When you are ill, check your blood glucose more often than usual. Follow your sick day plan whenever you cannot eat or drink normally. Make this  plan in advance with your health care provider.  Always check your blood glucose before, during, and after exercise. How is this treated? This condition can often be treated by immediately eating or drinking something that contains sugar, such as:  Fruit juice, 4-6 oz (120-150 mL).  Regular (not diet) soda, 4-6 oz (120-150 mL).  Low-fat milk, 4 oz (120 mL).  Several pieces of hard candy.  Sugar or honey, 1 Tbsp (15 mL). Treating hypoglycemia if you have diabetes If you are alert and able to swallow safely, follow the 15:15 rule:  Take 15 grams of a rapid-acting carbohydrate. Talk with your health care provider about how much you should take.  Rapid-acting options include: ? Glucose pills (take 15 grams). ? 6-8 pieces of hard candy. ? 4-6 oz (120-150 mL) of fruit juice. ? 4-6 oz (120-150 mL) of regular (not diet) soda.  Check your blood glucose 15 minutes after you take the carbohydrate.  If the repeat blood glucose level is still at or below 70 mg/dL (3.9 mmol/L), take 15 grams of a carbohydrate again.  If your blood glucose level does not increase above 70 mg/dL (3.9 mmol/L) after 3 tries, seek emergency medical care.  After your blood glucose level returns to normal, eat a meal or a snack within 1 hour. Treating severe hypoglycemia Severe hypoglycemia is when your blood glucose level is at or below 54 mg/dL (3 mmol/L). Severe hypoglycemia is a medical emergency. Get medical help right away. If you have severe hypoglycemia and you cannot eat or drink, you may need an injection of glucagon. A family member or close friend should learn how to check your blood glucose and how to give you a glucagon injection. Ask your health care provider if you need to have an emergency glucagon injection kit available. Severe hypoglycemia may need to be treated in a hospital. The treatment may include getting glucose through an IV. You may also need treatment for the cause of your  hypoglycemia. Where to find more information  American Diabetes Association: www.diabetes.[employer] of Diabetes and Digestive and Kidney Diseases: [employer] Contact a health care provider if:  You have problems keeping your blood glucose in your target range.  You have frequent episodes of hypoglycemia. Get help right away if:  You continue to have hypoglycemia symptoms after eating or drinking something containing glucose.  Your blood glucose level is at or below 54 mg/dL (3 mmol/L).  You faint.  You have a seizure. These symptoms may represent a serious problem that is an emergency. Do not wait to see if the symptoms will go away. Get medical help right away. Call your local emergency services (911 in the [place] Summary  Know the symptoms of hypoglycemia, and when you are at risk for it (such as during exercise or when you are sick). Check your blood glucose often when you are at risk for hypoglycemia.  Hypoglycemia can develop quickly, and it can be dangerous if it is not treated right away. If you have a history of severe hypoglycemia, make sure you know how to use your glucagon injection kit.  Make sure you know how to treat hypoglycemia. Keep a carbohydrate snack available when you may be at risk for hypoglycemia. This information is not intended to replace advice given to you by your health care provider. Make sure you discuss any questions you have with your health care provider. Document Revised: 06/18/2018 Document Reviewed: 10/22/2016 Elsevier Patient Education  [place].

## 2019-07-13 NOTE — Telephone Encounter (Signed)
Pt called and resch appt with Dr. Creta Levin for today.

## 2019-07-13 NOTE — Telephone Encounter (Signed)
Pt daughter called to resch appt for today 07/13/19 Because Colette Ribas was out of office. Pts daughter wanted Colette Ribas to know the eye Doctor she was referred too was terrible. She states she wouldn't recommend sending anyone there. Please advise.

## 2019-08-02 ENCOUNTER — Encounter: Payer: 59 | Admitting: Adult Health Nurse Practitioner

## 2019-08-18 ENCOUNTER — Telehealth: Payer: Self-pay | Admitting: Family Medicine

## 2019-08-18 DIAGNOSIS — E1142 Type 2 diabetes mellitus with diabetic polyneuropathy: Secondary | ICD-10-CM

## 2019-08-18 MED ORDER — METFORMIN HCL 500 MG PO TABS: 500.0000 mg | ORAL_TABLET | Freq: Every day | ORAL | 1 refills | Status: DC

## 2019-08-18 NOTE — Telephone Encounter (Signed)
Medication Refill - Medication:  metFORMIN (GLUCOPHAGE) 500 MG tablet   Has the patient contacted their pharmacy? Yes advised to call office. Patient's daughter states they have requested this medication and it has been 3+ days. Patient is completely out and needs medication.   Preferred Pharmacy (with phone number or street name):  [facility] [address] [place]), [place] - [id number] Phone:  [phone]  Fax:  [phone]     Agent: Please be advised that RX refills may take up to 3 business days. We ask that you follow-up with your pharmacy.

## 2019-08-18 NOTE — Telephone Encounter (Signed)
Requested medication (s) are due for refill today:yes  Requested medication (s) are on the active medication list:yes  Last refill: 06/14/19   Future visit scheduled:No  Notes to clinic:  Per daughter several request Patient is out. Please review lab note by Dr Nolon Rod 06/14/19.Patient dose was to change!!! Patient is out of medication!!!    Requested Prescriptions  Pending Prescriptions Disp Refills   metFORMIN (GLUCOPHAGE) 500 MG tablet 90 tablet 1    Sig: Take 1 tablet (500 mg total) by mouth daily with breakfast.      Endocrinology:  Diabetes - Biguanides Passed - 08/18/2019  2:00 PM      Passed - Cr in normal range and within 360 days    Creatinine, Ser  Date Value Ref Range Status  06/14/2019 0.66 0.57 - 1.00 mg/dL Final          Passed - HBA1C is between 0 and 7.9 and within 180 days    Hgb A1c MFr Bld  Date Value Ref Range Status  06/14/2019 7.8 (H) 4.8 - 5.6 % Final    Comment:             Prediabetes: 5.7 - 6.4          Diabetes: >6.4          Glycemic control for adults with diabetes: <7.0           Passed - eGFR in normal range and within 360 days    GFR calc Af Amer  Date Value Ref Range Status  06/14/2019 115 >59 mL/min/1.73 Final   GFR calc non Af Amer  Date Value Ref Range Status  06/14/2019 100 >59 mL/min/1.73 Final          Passed - Valid encounter within last 6 months    Recent Outpatient Visits           1 month ago Hyperlipidemia associated with type 2 diabetes mellitus ([place])   Primary Care at [facility], Arlie Solomons, MD   2 months ago Annual physical exam   [facility] at [facility], Lilia Argue, MD   1 year ago Diabetes mellitus type 2, noninsulin dependent [facility])   Primary Care at [facility], Arlie Solomons, MD   1 year ago Diabetes mellitus type 2, noninsulin dependent [facility])   Primary Care at [facility], Arlie Solomons, MD   1 year ago Newly diagnosed diabetes [facility])   Primary Care at [facility], Arlie Solomons, MD

## 2019-08-24 NOTE — Telephone Encounter (Signed)
Please call pt and schedule a TOC appointment with new provider. [place] 90 day refill was given 08/18/19.

## 2019-08-24 NOTE — Telephone Encounter (Signed)
Please call daughter about this refill request  [phone]

## 2019-08-26 NOTE — Telephone Encounter (Signed)
Pt needs an OV with [place]. Not a TOC per new scheduling guidelines.

## 2019-08-26 NOTE — Telephone Encounter (Signed)
Scheduled

## 2019-09-23 ENCOUNTER — Ambulatory Visit: Payer: 59 | Admitting: Family Medicine

## 2020-02-06 ENCOUNTER — Other Ambulatory Visit: Payer: Self-pay

## 2020-02-06 ENCOUNTER — Ambulatory Visit: Payer: 59 | Admitting: Family Medicine

## 2020-02-06 ENCOUNTER — Encounter: Payer: Self-pay | Admitting: Family Medicine

## 2020-02-06 VITALS — BP 134/83 | HR 96 | Temp 98.0°F | Ht 58.5 in | Wt 173.0 lb

## 2020-02-06 DIAGNOSIS — Z1211 Encounter for screening for malignant neoplasm of colon: Secondary | ICD-10-CM

## 2020-02-06 DIAGNOSIS — G8929 Other chronic pain: Secondary | ICD-10-CM

## 2020-02-06 DIAGNOSIS — E1142 Type 2 diabetes mellitus with diabetic polyneuropathy: Secondary | ICD-10-CM

## 2020-02-06 DIAGNOSIS — E785 Hyperlipidemia, unspecified: Secondary | ICD-10-CM

## 2020-02-06 DIAGNOSIS — I10 Essential (primary) hypertension: Secondary | ICD-10-CM

## 2020-02-06 DIAGNOSIS — M546 Pain in thoracic spine: Secondary | ICD-10-CM

## 2020-02-06 DIAGNOSIS — Z1231 Encounter for screening mammogram for malignant neoplasm of breast: Secondary | ICD-10-CM

## 2020-02-06 MED ORDER — DICLOFENAC SODIUM 1 % EX GEL: 2.0000 g | Freq: Four times a day (QID) | CUTANEOUS | 3 refills | Status: DC

## 2020-02-06 NOTE — Progress Notes (Signed)
11/29/20215:01 PM  Tracy Sanders Jun 25, 1965, 54 y.o., female [id number]  Chief Complaint  Patient presents with  . Diabetes    checks levels at home , says stress levels are causing elevated reads, med refills   . Hypertension    medication refills   . Hyperlipidemia  . blisters in hands    one week     HPI:   Patient is a 54 y.o. female with past medical history significant for DM, HTN, HLD who presents today for routine follow up.  Ophthalmology Was previously referred, would like a second opinion Believes she has a Astigmatism  ON her hands small white spots peeling Burning and stinging to her hands Started 7 days ago  Back pain Bilateral sides lower Taking aleve for pain This has been going on for awhile  Diabetes Metformin 500 mg daily Issues with neuropathy Doesn't check home BG regularly  Lab Results  Component Value Date   HGBA1C 7.8 (H) 06/14/2019   HLD Atorvastatin 78m daily Denies myalgias Lab Results  Component Value Date   CHOL 160 06/14/2019   HDL 43 06/14/2019   LDLCALC 78 06/14/2019   TRIG 234 (H) 06/14/2019   CHOLHDL 3.7 06/14/2019   The 10-year ASCVD risk score (Mikey BussingDC Jr., et al., 2013) is: 14.1%   Values used to calculate the score:     Age: 10years     Sex: Female     Is Non-Hispanic African American: No     Diabetic: Yes     Tobacco smoker: Yes     Systolic Blood Pressure: 1952mmHg     Is BP treated: Yes     HDL Cholesterol: 43 mg/dL     Total Cholesterol: 160 mg/dL  HTN Lisinopril 10 mg daily BP Readings from Last 3 Encounters:  02/06/20 134/83  07/13/19 (!) 153/93  06/14/19 134/85   Screenings Last mammogram: 2010 Last Pap: 12/18/2016 nil (need to schedule) Menopause: period 1 year ago Colon: Never has done  [facility] 02/06/2020 07/13/2019 06/14/2019  Decreased Interest 0 0 0  Down, Depressed, Hopeless 0 0 0  PHQ - 2 Score 0 0 0    Fall Risk  02/06/2020 07/13/2019 06/14/2019 07/15/2018 07/15/2018    Falls in the past year? 0 0 0 0 0  Number falls in past yr: 0 0 0 0 0  Injury with Fall? 0 0 0 0 0  Follow up - Falls evaluation completed Falls evaluation completed Falls evaluation completed Falls evaluation completed     Allergies  Allergen Reactions  . Other     "a cold medicine"  - not sure which one    Prior to Admission medications   Medication Sig Start Date End Date Taking? Authorizing Provider  atorvastatin (LIPITOR) 80 MG tablet Take 1 tablet (80 mg total) by mouth daily. 06/14/19  Yes SRutherford Guys MD  blood glucose meter kit and supplies Dispense based on patient and insurance preference. Use up to four times daily as directed. (FOR ICD-10 E10.9, E11.9). 09/17/17  Yes SForrest Moron MD  cyclobenzaprine (FLEXERIL) 10 MG tablet Take 1 tablet (10 mg total) by mouth 3 (three) times daily as needed for muscle spasms. 06/14/19  Yes SRutherford Guys MD  Elastic Bandages & Supports (MEDICAL COMPRESSION STOCKINGS) MISC Wear daily to relieve pain from varicose veins. IW41.3246/01/26  Yes Stallings, ZGwendolyn FillA, MD  glucose blood (IGLUCOSE TEST STRIPS) test strip Use as instructed 09/17/17  Yes Stallings, Zoe A,  MD  ibuprofen (ADVIL,MOTRIN) 600 MG tablet Take 1 tablet (600 mg total) by mouth every 8 (eight) hours as needed. 09/17/17  Yes Forrest Moron, MD  Lancet Devices (LANCING DEVICE) MISC Use to check blood glucose once daily and as needed 09/17/17  Yes Stallings, Zoe A, MD  Lancets MISC Use to check blood glucose once daily and as needed. E11.9 09/17/17  Yes Stallings, Zoe A, MD  lisinopril (ZESTRIL) 10 MG tablet Take 1 tablet (10 mg total) by mouth daily. 06/14/19  Yes Rutherford Guys, MD  metFORMIN (GLUCOPHAGE) 500 MG tablet Take 1 tablet (500 mg total) by mouth daily with breakfast. 08/18/19  Yes Rutherford Guys, MD    Past Medical History:  Diagnosis Date  . Diabetes mellitus without complication ([place])   . Hypercholesteremia   . Hypertension     Past Surgical History:   Procedure Laterality Date  . APPENDECTOMY      Social History   Tobacco Use  . Smoking status: Current Every Day Smoker    Packs/day: 0.50  . Smokeless tobacco: Never Used  Substance Use Topics  . Alcohol use: No    Family History  Problem Relation Age of Onset  . Stroke Mother   . Hypertension Sister   . Diabetes Sister   . Hypertension Brother   . Diabetes Brother     Review of Systems  Constitutional: Negative for chills, fever and malaise/fatigue.  Eyes: Negative for blurred vision and double vision.  Respiratory: Negative for cough, shortness of breath and wheezing.   Cardiovascular: Negative for chest pain, palpitations and leg swelling.  Gastrointestinal: Negative for abdominal pain, blood in stool, constipation, diarrhea, heartburn, nausea and vomiting.  Genitourinary: Negative for dysuria, frequency and hematuria.  Musculoskeletal: Positive for back pain. Negative for joint pain.  Skin: Negative for rash.  Neurological: Negative for dizziness, weakness and headaches.     OBJECTIVE:  Today's Vitals   02/06/20 1429  BP: 134/83  Pulse: 96  Temp: 98 F (36.7 C)  SpO2: 97%  Weight: 173 lb (78.5 kg)  Height: 4' 10.5" (1.486 m)   Body mass index is 35.54 kg/m.   Physical Exam Constitutional:      General: She is not in acute distress.    Appearance: Normal appearance. She is not ill-appearing.  HENT:     Head: Normocephalic.  Cardiovascular:     Rate and Rhythm: Normal rate and regular rhythm.     Pulses: Normal pulses.     Heart sounds: Normal heart sounds. No murmur heard.  No friction rub. No gallop.   Pulmonary:     Effort: Pulmonary effort is normal. No respiratory distress.     Breath sounds: Normal breath sounds. No stridor. No wheezing, rhonchi or rales.  Abdominal:     General: Bowel sounds are normal.     Palpations: Abdomen is soft.     Tenderness: There is no abdominal tenderness.  Musculoskeletal:     Thoracic back: Tenderness  (bilateral sides) present.     Lumbar back: Normal.     Right lower leg: No edema.     Left lower leg: No edema.  Skin:    General: Skin is warm and dry.  Neurological:     Mental Status: She is alert and oriented to person, place, and time.  Psychiatric:        Mood and Affect: Mood normal.        Behavior: Behavior normal.     No results  found for this or any previous visit (from the past 24 hour(s)).  No results found.   ASSESSMENT and PLAN  Problem List Items Addressed This Visit      Cardiovascular and Mediastinum   Essential hypertension   Relevant Orders   CMP14+EGFR     Endocrine   DM type 2 with diabetic peripheral neuropathy ([place]) - Primary   Relevant Orders   Hemoglobin A1c   Ambulatory referral to Ophthalmology   CBC On metformin 51m daily, will follow up with lab results Not at A1c goal< 6.5 Lab Results  Component Value Date   HGBA1C 7.8 (H) 06/14/2019      Other Visit Diagnoses    Dyslipidemia       Relevant Orders   Lipid panel Will follow up with lab results Atorvastatin 816mdaily    Special screening for malignant neoplasms, colon       Relevant Orders   Cologuard   Encounter for screening mammogram for malignant neoplasm of breast       Relevant Orders   MS DIGITAL SCREENING TOMO BILATERAL   Chronic bilateral thoracic back pain       Relevant Medications   diclofenac Sodium (VOLTAREN) 1 % GEL Declined PT at this time Exercises provided     Needs to schedule PAP.  Return in about 3 months (around 05/07/2020).   KeHuston Foleyust, FNP-BC [facility] [id number].  [phone]

## 2020-02-06 NOTE — Patient Instructions (Addendum)
Back Exercises These exercises help to make your trunk and back strong. They also help to keep the lower back flexible. Doing these exercises can help to prevent back pain or lessen existing pain.  If you have back pain, try to do these exercises 2-3 times each day or as told by your doctor.  As you get better, do the exercises once each day. Repeat the exercises more often as told by your doctor.  To stop back pain from coming back, do the exercises once each day, or as told by your doctor. Exercises Single knee to chest Do these steps 3-5 times in a row for each leg: 1. Lie on your back on a firm bed or the floor with your legs stretched out. 2. Bring one knee to your chest. 3. Grab your knee or thigh with both hands and hold them it in place. 4. Pull on your knee until you feel a gentle stretch in your lower back or buttocks. 5. Keep doing the stretch for 10-30 seconds. 6. Slowly let go of your leg and straighten it. Pelvic tilt Do these steps 5-10 times in a row: 1. Lie on your back on a firm bed or the floor with your legs stretched out. 2. Bend your knees so they point up to the ceiling. Your feet should be flat on the floor. 3. Tighten your lower belly (abdomen) muscles to press your lower back against the floor. This will make your tailbone point up to the ceiling instead of pointing down to your feet or the floor. 4. Stay in this position for 5-10 seconds while you gently tighten your muscles and breathe evenly. Cat-cow Do these steps until your lower back bends more easily: 1. Get on your hands and knees on a firm surface. Keep your hands under your shoulders, and keep your knees under your hips. You may put padding under your knees. 2. Let your head hang down toward your chest. Tighten (contract) the muscles in your belly. Point your tailbone toward the floor so your lower back becomes rounded like the back of a cat. 3. Stay in this position for 5 seconds. 4. Slowly lift your  head. Let the muscles of your belly relax. Point your tailbone up toward the ceiling so your back forms a sagging arch like the back of a cow. 5. Stay in this position for 5 seconds.  Press-ups Do these steps 5-10 times in a row: 1. Lie on your belly (face-down) on the floor. 2. Place your hands near your head, about shoulder-width apart. 3. While you keep your back relaxed and keep your hips on the floor, slowly straighten your arms to raise the top half of your body and lift your shoulders. Do not use your back muscles. You may change where you place your hands in order to make yourself more comfortable. 4. Stay in this position for 5 seconds. 5. Slowly return to lying flat on the floor.  Bridges Do these steps 10 times in a row: 1. Lie on your back on a firm surface. 2. Bend your knees so they point up to the ceiling. Your feet should be flat on the floor. Your arms should be flat at your sides, next to your body. 3. Tighten your butt muscles and lift your butt off the floor until your waist is almost as high as your knees. If you do not feel the muscles working in your butt and the back of your thighs, slide your feet 1-2 inches  farther away from your butt. 4. Stay in this position for 3-5 seconds. 5. Slowly lower your butt to the floor, and let your butt muscles relax. If this exercise is too easy, try doing it with your arms crossed over your chest. Belly crunches Do these steps 5-10 times in a row: 1. Lie on your back on a firm bed or the floor with your legs stretched out. 2. Bend your knees so they point up to the ceiling. Your feet should be flat on the floor. 3. Cross your arms over your chest. 4. Tip your chin a little bit toward your chest but do not bend your neck. 5. Tighten your belly muscles and slowly raise your chest just enough to lift your shoulder blades a tiny bit off of the floor. Avoid raising your body higher than that, because it can put too much stress on your low  back. 6. Slowly lower your chest and your head to the floor. Back lifts Do these steps 5-10 times in a row: 1. Lie on your belly (face-down) with your arms at your sides, and rest your forehead on the floor. 2. Tighten the muscles in your legs and your butt. 3. Slowly lift your chest off of the floor while you keep your hips on the floor. Keep the back of your head in line with the curve in your back. Look at the floor while you do this. 4. Stay in this position for 3-5 seconds. 5. Slowly lower your chest and your face to the floor. Contact a doctor if:  Your back pain gets a lot worse when you do an exercise.  Your back pain does not get better 2 hours after you exercise. If you have any of these problems, stop doing the exercises. Do not do them again unless your doctor says it is okay. Get help right away if:  You have sudden, very bad back pain. If this happens, stop doing the exercises. Do not do them again unless your doctor says it is okay. This information is not intended to replace advice given to you by your health care provider. Make sure you discuss any questions you have with your health care provider. Document Revised: 11/19/2017 Document Reviewed: 11/19/2017 Elsevier Patient Education  2020 [employer] Chronic Back Pain When back pain lasts longer than 3 months, it is called chronic back pain. Pain may get worse at certain times (flare-ups). There are things you can do at home to manage your pain. Follow these instructions at home: Activity      Avoid bending and other activities that make pain worse.  When standing: ? Keep your upper back and neck straight. ? Keep your shoulders pulled back. ? Avoid slouching.  When sitting: ? Keep your back straight. ? Relax your shoulders. Do not round your shoulders or pull them backward.  Do not sit or stand in one place for long periods of time.  Take short rest breaks during the day. Lying down or standing is usually  better than sitting. Resting can help relieve pain.  When sitting or lying down for a long time, do some mild activity or stretching. This will help to prevent stiffness and pain.  Get regular exercise. Ask your doctor what activities are safe for you.  Do not lift anything that is heavier than 10 lb (4.5 kg). To prevent injury when you lift things: ? Bend your knees. ? Keep the weight close to your body. ? Avoid twisting. Managing pain  If told, put ice on the painful area. Your doctor may tell you to use ice for 24-48 hours after a flare-up starts. ? Put ice in a plastic bag. ? Place a towel between your skin and the bag. ? Leave the ice on for 20 minutes, 2-3 times a day.  If told, put heat on the painful area as often as told by your doctor. Use the heat source that your doctor recommends, such as a moist heat pack or a heating pad. ? Place a towel between your skin and the heat source. ? Leave the heat on for 20-30 minutes. ? Remove the heat if your skin turns bright red. This is especially important if you are unable to feel pain, heat, or cold. You may have a greater risk of getting burned.  Soak in a warm bath. This can help relieve pain.  Take over-the-counter and prescription medicines only as told by your doctor. General instructions  Sleep on a firm mattress. Try lying on your side with your knees slightly bent. If you lie on your back, put a pillow under your knees.  Keep all follow-up visits as told by your doctor. This is important. Contact a doctor if:  You have pain that does not get better with rest or medicine. Get help right away if:  One or both of your arms or legs feel weak.  One or both of your arms or legs lose feeling (numbness).  You have trouble controlling when you poop (bowel movement) or pee (urinate).  You feel sick to your stomach (nauseous).  You throw up (vomit).  You have belly (abdominal) pain.  You have shortness of breath.  You  pass out (faint). Summary  When back pain lasts longer than 3 months, it is called chronic back pain.  Pain may get worse at certain times (flare-ups).  Use ice and heat as told by your doctor. Your doctor may tell you to use ice after flare-ups. This information is not intended to replace advice given to you by your health care provider. Make sure you discuss any questions you have with your health care provider. Document Revised: 06/17/2018 Document Reviewed: 10/09/2016 Elsevier Patient Education  [employer].  If you have lab work done today you will be contacted with your lab results within the next 2 weeks.  If you have not heard from [place] then please contact us. The fastest way to get your results is to register for My Chart.   IF you received an x-ray today, you will receive an invoice from [facility]. Please contact [facility] at [phone] with questions or concerns regarding your invoice.   IF you received labwork today, you will receive an invoice from [facility]. Please contact [facility] at [phone] with questions or concerns regarding your invoice.   Our billing staff will not be able to assist you with questions regarding bills from these companies.  You will be contacted with the lab results as soon as they are available. The fastest way to get your results is to activate your My Chart account. Instructions are located on the last page of this paperwork. If you have not heard from [place] regarding the results in 2 weeks, please contact this office.

## 2020-02-07 ENCOUNTER — Other Ambulatory Visit: Payer: Self-pay | Admitting: Family Medicine

## 2020-02-07 DIAGNOSIS — E1142 Type 2 diabetes mellitus with diabetic polyneuropathy: Secondary | ICD-10-CM

## 2020-02-07 LAB — CMP14+EGFR
ALT: 66 IU/L — ABNORMAL HIGH (ref 0–32)
AST: 31 IU/L (ref 0–40)
Albumin/Globulin Ratio: 1.6 (ref 1.2–2.2)
Albumin: 4.2 g/dL (ref 3.8–4.9)
Alkaline Phosphatase: 136 IU/L — ABNORMAL HIGH (ref 44–121)
BUN/Creatinine Ratio: 23 (ref 9–23)
BUN: 21 mg/dL (ref 6–24)
Bilirubin Total: 0.3 mg/dL (ref 0.0–1.2)
CO2: 21 mmol/L (ref 20–29)
Calcium: 9.4 mg/dL (ref 8.7–10.2)
Chloride: 101 mmol/L (ref 96–106)
Creatinine, Ser: 0.92 mg/dL (ref 0.57–1.00)
GFR calc Af Amer: 80 mL/min/{1.73_m2} (ref >59)
GFR calc non Af Amer: 70 mL/min/{1.73_m2} (ref >59)
Globulin, Total: 2.6 g/dL (ref 1.5–4.5)
Glucose: 351 mg/dL — ABNORMAL HIGH (ref 65–99)
Potassium: 4.3 mmol/L (ref 3.5–5.2)
Sodium: 139 mmol/L (ref 134–144)
Total Protein: 6.8 g/dL (ref 6.0–8.5)

## 2020-02-07 LAB — CBC
Hematocrit: 48.2 % — ABNORMAL HIGH (ref 34.0–46.6)
Hemoglobin: 15.9 g/dL (ref 11.1–15.9)
MCH: 27.9 pg (ref 26.6–33.0)
MCHC: 33 g/dL (ref 31.5–35.7)
MCV: 85 fL (ref 79–97)
Platelets: 149 10*3/uL — ABNORMAL LOW (ref 150–450)
RBC: 5.69 x10E6/uL — ABNORMAL HIGH (ref 3.77–5.28)
RDW: 12.2 % (ref 11.7–15.4)
WBC: 6.9 10*3/uL (ref 3.4–10.8)

## 2020-02-07 LAB — LIPID PANEL
Chol/HDL Ratio: 4.5 ratio — ABNORMAL HIGH (ref 0.0–4.4)
Cholesterol, Total: 157 mg/dL (ref 100–199)
HDL: 35 mg/dL — ABNORMAL LOW (ref >39)
LDL Chol Calc (NIH): 57 mg/dL (ref 0–99)
Triglycerides: 426 mg/dL — ABNORMAL HIGH (ref 0–149)
VLDL Cholesterol Cal: 65 mg/dL — ABNORMAL HIGH (ref 5–40)

## 2020-02-07 LAB — HEMOGLOBIN A1C
Est. average glucose Bld gHb Est-mCnc: 303 mg/dL
Hgb A1c MFr Bld: 12.2 % — ABNORMAL HIGH (ref 4.8–5.6)

## 2020-02-07 MED ORDER — CANAGLIFLOZIN 100 MG PO TABS: 100.0000 mg | ORAL_TABLET | Freq: Every day | ORAL | 6 refills | Status: DC

## 2020-02-07 MED ORDER — RYBELSUS 3 MG PO TABS: 1.0000 | ORAL_TABLET | Freq: Every day | ORAL | 0 refills | Status: DC

## 2020-02-07 MED ORDER — METFORMIN HCL 500 MG PO TABS: 500.0000 mg | ORAL_TABLET | Freq: Two times a day (BID) | ORAL | 1 refills | Status: DC

## 2020-02-07 MED ORDER — RYBELSUS 7 MG PO TABS: 1.0000 | ORAL_TABLET | Freq: Every day | ORAL | 3 refills | Status: DC

## 2020-02-07 NOTE — Progress Notes (Signed)
If you could let Tracy Sanders know (the phone number on the chart is her daughters, who speaks English, you can discuss with her) her A1c came back extremely elevated at 12.2. I am going to increase her Metformin to 500mg  twice a day. I am adding on two new medications. The first is Rybelsus this is a pill she will take in the morning daily. She will take the 3 mg tab for 30 days and then transition to the 7mg  tab. The other medication I am starting is Invokana and she will take 100mg  of that or 1 tab daily. If either of these medications are expensive let me know ASAP and we can try other options. I will need a follow up with her in 3 months. The orders have been sent to her pharmacy.

## 2020-02-10 ENCOUNTER — Telehealth: Payer: Self-pay | Admitting: Family Medicine

## 2020-02-10 ENCOUNTER — Other Ambulatory Visit: Payer: Self-pay | Admitting: Registered Nurse

## 2020-02-10 DIAGNOSIS — R112 Nausea with vomiting, unspecified: Secondary | ICD-10-CM

## 2020-02-10 MED ORDER — ONDANSETRON HCL 4 MG PO TABS: 4.0000 mg | ORAL_TABLET | Freq: Three times a day (TID) | ORAL | 0 refills | Status: AC | PRN

## 2020-02-10 NOTE — Telephone Encounter (Signed)
Pt's daughter called in saying her mother has thrown up 3 times today, she's weak with no fever. She feels it's her mom's script Semaglutide (RYBELSUS) 3 MG TABS [[id number]]  She would like to know if her mom should stop taking it?    Then pt's daughter said her mom can not afford canagliflozin (INVOKANA) 100 MG TABS tablet [[id number]]  That was prescribed to her. It's costs $1,000 and insurance can get it down to $200. She needs this script urgently, but something she can afford. Please advise at [phone].

## 2020-02-10 NOTE — Telephone Encounter (Signed)
Tracy Sanders I know the weekend is coming up and I am not sure if Tracy Sanders would be able to handle this since she is not here today. Therefore I included you in on this message in case she do not see this msg over the weekend. I spoke with patients daughter and she stated she told mother to stop the Rybelsus since it was making her sick and weak. Daughter also asked if patient should increase her metformin until we send in a new rx. Please advise if you can assist.

## 2020-02-10 NOTE — Telephone Encounter (Signed)
She's okay to stop this medication for now - I can send over a few tabs of zofran to help with nausea and vomiting. If this continues she will need to see urgent care / ED  Thank you  Luan Pulling

## 2020-02-10 NOTE — Progress Notes (Signed)
Pt having nausea with vomiting after starting rybelsus - sending zofran 4mg  PO q8h PRN. Suggest brat diet. Hold rybelsus until at least Monday when PCP can adjust if needed  Wednesday, NP

## 2020-02-11 ENCOUNTER — Other Ambulatory Visit: Payer: Self-pay | Admitting: Family Medicine

## 2020-02-11 DIAGNOSIS — E1142 Type 2 diabetes mellitus with diabetic polyneuropathy: Secondary | ICD-10-CM

## 2020-02-11 MED ORDER — TRULICITY 0.75 MG/0.5ML ~~LOC~~ SOAJ: 0.7500 mg | SUBCUTANEOUS | 8 refills | Status: DC

## 2020-02-11 NOTE — Telephone Encounter (Signed)
Hello, If you could let Tracy Sanders know I discontinued the rybelsus and ordered Trulicity for her which is a once weekly injectable. As for the Metformin continue to slowly increase that medication. That medication can cause GI upset as well if increased too quickly.

## 2020-02-13 NOTE — Telephone Encounter (Signed)
Spoke with the daughter to inform her of Trulicity inj. , she verbalizes understanding.

## 2020-04-01 ENCOUNTER — Other Ambulatory Visit: Payer: Self-pay

## 2020-04-01 ENCOUNTER — Emergency Department (HOSPITAL_COMMUNITY): Payer: 59

## 2020-04-01 ENCOUNTER — Emergency Department (HOSPITAL_COMMUNITY)
Admission: EM | Admit: 2020-04-01 | Discharge: 2020-04-02 | Disposition: A | Discharge status: Home / Self Care | Payer: 59 | Attending: Emergency Medicine | Admitting: Emergency Medicine

## 2020-04-01 DIAGNOSIS — U071 COVID-19: Secondary | ICD-10-CM | POA: Diagnosis not present

## 2020-04-01 DIAGNOSIS — R059 Cough, unspecified: Secondary | ICD-10-CM | POA: Diagnosis present

## 2020-04-01 DIAGNOSIS — Z5321 Procedure and treatment not carried out due to patient leaving prior to being seen by health care provider: Secondary | ICD-10-CM | POA: Diagnosis not present

## 2020-04-01 LAB — CBC WITH DIFFERENTIAL/PLATELET
Abs Immature Granulocytes: 0.01 10*3/uL (ref 0.00–0.07)
Basophils Absolute: 0 10*3/uL (ref 0.0–0.1)
Basophils Relative: 0 %
Eosinophils Absolute: 0 10*3/uL (ref 0.0–0.5)
Eosinophils Relative: 0 %
HCT: 44.1 % (ref 36.0–46.0)
Hemoglobin: 14.4 g/dL (ref 12.0–15.0)
Immature Granulocytes: 0 %
Lymphocytes Relative: 53 %
Lymphs Abs: 1.6 10*3/uL (ref 0.7–4.0)
MCH: 27.5 pg (ref 26.0–34.0)
MCHC: 32.7 g/dL (ref 30.0–36.0)
MCV: 84.3 fL (ref 80.0–100.0)
Monocytes Absolute: 0.3 10*3/uL (ref 0.1–1.0)
Monocytes Relative: 9 %
Neutro Abs: 1.2 10*3/uL — ABNORMAL LOW (ref 1.7–7.7)
Neutrophils Relative %: 38 %
Platelets: 88 10*3/uL — ABNORMAL LOW (ref 150–400)
RBC: 5.23 MIL/uL — ABNORMAL HIGH (ref 3.87–5.11)
RDW: 13.2 % (ref 11.5–15.5)
WBC: 3 10*3/uL — ABNORMAL LOW (ref 4.0–10.5)
nRBC: 0 % (ref 0.0–0.2)

## 2020-04-01 LAB — COMPREHENSIVE METABOLIC PANEL
ALT: 65 U/L — ABNORMAL HIGH (ref 0–44)
AST: 66 U/L — ABNORMAL HIGH (ref 15–41)
Albumin: 3.2 g/dL — ABNORMAL LOW (ref 3.5–5.0)
Alkaline Phosphatase: 61 U/L (ref 38–126)
Anion gap: 11 (ref 5–15)
BUN: 10 mg/dL (ref 6–20)
CO2: 22 mmol/L (ref 22–32)
Calcium: 8.1 mg/dL — ABNORMAL LOW (ref 8.9–10.3)
Chloride: 104 mmol/L (ref 98–111)
Creatinine, Ser: 0.58 mg/dL (ref 0.44–1.00)
GFR, Estimated: >60 mL/min (ref >60)
Glucose, Bld: 147 mg/dL — ABNORMAL HIGH (ref 70–99)
Potassium: 3.3 mmol/L — ABNORMAL LOW (ref 3.5–5.1)
Sodium: 137 mmol/L (ref 135–145)
Total Bilirubin: 0.4 mg/dL (ref 0.3–1.2)
Total Protein: 6.7 g/dL (ref 6.5–8.1)

## 2020-04-01 NOTE — ED Notes (Signed)
[place] [phone] available to translate if necessary 

## 2020-04-01 NOTE — ED Triage Notes (Signed)
Reported tested + for Covid today; concern for PNA

## 2020-04-02 NOTE — ED Notes (Signed)
Called pt for vitals and no answer  

## 2020-04-03 ENCOUNTER — Other Ambulatory Visit: Payer: Self-pay

## 2020-04-03 ENCOUNTER — Encounter: Payer: Self-pay | Admitting: Family Medicine

## 2020-04-03 ENCOUNTER — Telehealth (INDEPENDENT_AMBULATORY_CARE_PROVIDER_SITE_OTHER): Payer: 59 | Admitting: Family Medicine

## 2020-04-03 DIAGNOSIS — U071 COVID-19: Secondary | ICD-10-CM

## 2020-04-03 DIAGNOSIS — E1142 Type 2 diabetes mellitus with diabetic polyneuropathy: Secondary | ICD-10-CM | POA: Diagnosis not present

## 2020-04-03 DIAGNOSIS — I10 Essential (primary) hypertension: Secondary | ICD-10-CM

## 2020-04-03 DIAGNOSIS — Z09 Encounter for follow-up examination after completed treatment for conditions other than malignant neoplasm: Secondary | ICD-10-CM | POA: Diagnosis not present

## 2020-04-03 MED ORDER — BENZONATATE 100 MG PO CAPS: 100.0000 mg | ORAL_CAPSULE | Freq: Three times a day (TID) | ORAL | 0 refills | Status: DC | PRN

## 2020-04-03 MED ORDER — LISINOPRIL 10 MG PO TABS: 10.0000 mg | ORAL_TABLET | Freq: Every day | ORAL | 0 refills | Status: DC

## 2020-04-03 MED ORDER — PREDNISONE 20 MG PO TABS: ORAL_TABLET | ORAL | 0 refills | Status: AC

## 2020-04-03 MED ORDER — METFORMIN HCL 500 MG PO TABS: 500.0000 mg | ORAL_TABLET | Freq: Two times a day (BID) | ORAL | 1 refills | Status: AC

## 2020-04-03 NOTE — Patient Instructions (Addendum)
 COVID-19: What to Do if You Are Sick If you have a fever, cough or other symptoms, you might have COVID-19. Most people have mild illness and are able to recover at home. If you are sick:  Keep track of your symptoms.  If you have an emergency warning sign (including trouble breathing), call 911. Steps to help prevent the spread of COVID-19 if you are sick If you are sick with COVID-19 or think you might have COVID-19, follow the steps below to care for yourself and to help protect other people in your home and community. Stay home except to get medical care  Stay home. Most people with COVID-19 have mild illness and can recover at home without medical care. Do not leave your home, except to get medical care. Do not visit public areas.  Take care of yourself. Get rest and stay hydrated. Take over-the-counter medicines, such as acetaminophen, to help you feel better.  Stay in touch with your doctor. Call before you get medical care. Be sure to get care if you have trouble breathing, or have any other emergency warning signs, or if you think it is an emergency.  Avoid public transportation, ride-sharing, or taxis. Separate yourself from other people As much as possible, stay in a specific room and away from other people and pets in your home. If possible, you should use a separate bathroom. If you need to be around other people or animals in or outside of the home, wear a mask. Tell your close contactsthat they may have been exposed to COVID-19. An infected person can spread COVID-19 starting 48 hours (or 2 days) before the person has any symptoms or tests positive. By letting your close contacts know they may have been exposed to COVID-19, you are helping to protect everyone.  Additional guidance is available for those living in close quarters and shared housing.  See COVID-19 and Animals if you have questions about pets.  If you are diagnosed with COVID-19, someone from the health  department may call you. Answer the call to slow the spread. Monitor your symptoms  Symptoms of COVID-19 include fever, cough, or other symptoms.  Follow care instructions from your healthcare provider and local health department. Your local health authorities may give instructions on checking your symptoms and reporting information. When to seek emergency medical attention Look for emergency warning signs* for COVID-19. If someone is showing any of these signs, seek emergency medical care immediately:  Trouble breathing  Persistent pain or pressure in the chest  New confusion  Inability to wake or stay awake  Pale, gray, or blue-colored skin, lips, or nail beds, depending on skin tone *This list is not all possible symptoms. Please call your medical provider for any other symptoms that are severe or concerning to you. Call 911 or call ahead to your local emergency facility: Notify the operator that you are seeking care for someone who has or may have COVID-19. Call ahead before visiting your doctor  Call ahead. Many medical visits for routine care are being postponed or done by phone or telemedicine.  If you have a medical appointment that cannot be postponed, call your doctor's office, and tell them you have or may have COVID-19. This will help the office protect themselves and other patients. Get  tested  If you have symptoms of COVID-19, get tested. While waiting for test results, you stay away from others, including staying apart from those living in your household.  You can visit your state,   tribal, local, and territorialhealth department's website to look for the latest local information on testing sites. If you are sick, wear a mask over your nose and mouth  You should wear a mask over your nose and mouth if you must be around other people or animals, including pets (even at home).  You don't need to wear the mask if you are alone. If you can't put on a mask (because of  trouble breathing, for example), cover your coughs and sneezes in some other way. Try to stay at least 6 feet away from other people. This will help protect the people around you.  Masks should not be placed on young children under age 2 years, anyone who has trouble breathing, or anyone who is not able to remove the mask without help. Note: During the COVID-19 pandemic, medical grade facemasks are reserved for healthcare workers and some first responders. Cover your coughs and sneezes  Cover your mouth and nose with a tissue when you cough or sneeze.  Throw away used tissues in a lined trash can.  Immediately wash your hands with soap and water for at least 20 seconds. If soap and water are not available, clean your hands with an alcohol-based hand sanitizer that contains at least 60% alcohol. Clean your hands often  Wash your hands often with soap and water for at least 20 seconds. This is especially important after blowing your nose, coughing, or sneezing; going to the bathroom; and before eating or preparing food.  Use hand sanitizer if soap and water are not available. Use an alcohol-based hand sanitizer with at least 60% alcohol, covering all surfaces of your hands and rubbing them together until they feel dry.  Soap and water are the best option, especially if hands are visibly dirty.  Avoid touching your eyes, nose, and mouth with unwashed hands.  Handwashing Tips Avoid sharing personal household items  Do not share dishes, drinking glasses, cups, eating utensils, towels, or bedding with other people in your home.  Wash these items thoroughly after using them with soap and water or put in the dishwasher. Clean all "high-touch" surfaces everyday  Clean and disinfect high-touch surfaces in your "sick room" and bathroom; wear disposable gloves. Let someone else clean and disinfect surfaces in common areas, but you should clean your bedroom and bathroom, if possible.  If a  caregiver or other person needs to clean and disinfect a sick person's bedroom or bathroom, they should do so on an as-needed basis. The caregiver/other person should wear a mask and disposable gloves prior to cleaning. They should wait as long as possible after the person who is sick has used the bathroom before coming in to clean and use the bathroom. ? High-touch surfaces include phones, remote controls, counters, tabletops, doorknobs, bathroom fixtures, toilets, keyboards, tablets, and bedside tables.  Clean and disinfect areas that may have blood, stool, or body fluids on them.  Use household cleaners and disinfectants. Clean the area or item with soap and water or another detergent if it is dirty. Then, use a household disinfectant. ? Be sure to follow the instructions on the label to ensure safe and effective use of the product. Many products recommend keeping the surface wet for several minutes to ensure germs are killed. Many also recommend precautions such as wearing gloves and making sure you have good ventilation during use of the product. ? Use a product from EPA's List N: Disinfectants for Coronavirus (COVID-19). ? Complete Disinfection Guidance When you can   be around others after being sick with COVID-19 Deciding when you can be around others is different for different situations. Find out when you can safely end home isolation. For any additional questions about your care, contact your healthcare provider or state or local health department. 05/25/2019 Content source: National Center for Immunization and Respiratory Diseases (NCIRD), Division of Viral Diseases This information is not intended to replace advice given to you by your health care provider. Make sure you discuss any questions you have with your health care provider. Document Revised: 01/09/2020 Document Reviewed: 01/09/2020 Elsevier Patient Education  2021 Elsevier Inc.   If you have lab work done today you will be  contacted with your lab results within the next 2 weeks.  If you have not heard from us then please contact us. The fastest way to get your results is to register for My Chart.   IF you received an x-ray today, you will receive an invoice from [facility]. Please contact [facility] at [phone] with questions or concerns regarding your invoice.   IF you received labwork today, you will receive an invoice from [facility]. Please contact [facility] at [phone] with questions or concerns regarding your invoice.   Our billing staff will not be able to assist you with questions regarding bills from these companies.  You will be contacted with the lab results as soon as they are available. The fastest way to get your results is to activate your My Chart account. Instructions are located on the last page of this paperwork. If you have not heard from us regarding the results in 2 weeks, please contact this office.      

## 2020-04-03 NOTE — Progress Notes (Signed)
 Virtual Visit Note  I connected with patient on 04/03/20 at 1420 by telephone due to unable to work Epic video visit and verified that I am speaking with the correct person using two identifiers. Tracy Sanders is currently located at home and daughter with them during visit. The provider, Kelsea J Just, FNP is located in their office at time of visit.  I discussed the limitations, risks, security and privacy concerns of performing an evaluation and management service by telephone and the availability of in person appointments. I also discussed with the patient that there may be a patient responsible charge related to this service. The patient expressed understanding and agreed to proceed.   I provided 20 minutes of non-face-to-face time during this encounter.  Chief Complaint  Patient presents with  . Covid Positive    For about a month Cough/ congestion not better, fatigue, fever 2 days ago 100 F monoclonal infusion possible? Husband in hosspital with covid and pneumonia - taking tylenol and cough med    HPI ? Emergency room 1/23: There for 11 hours and never seen Potassium is low Liver enzymes elevated Platelets low and WBC low Continues to have productive cough and fatigue Symptoms started 03/10/20  Multiple questions about OTC treatments and monoclonal antibodies Wondering about teas and vitamins that work well.   Allergies  Allergen Reactions  . Other     "a cold medicine"  - not sure which one    Prior to Admission medications   Medication Sig Start Date End Date Taking? Authorizing Provider  atorvastatin (LIPITOR) 80 MG tablet Take 1 tablet (80 mg total) by mouth daily. 06/14/19  Yes Santiago Lago, Irma M, MD  blood glucose meter kit and supplies Dispense based on patient and insurance preference. Use up to four times daily as directed. (FOR ICD-10 E10.9, E11.9). 09/17/17  Yes Stallings, Zoe A, MD  diclofenac Sodium (VOLTAREN) 1 % GEL Apply 2 g topically 4 (four) times  daily. 02/06/20  Yes Just, Kelsea J, FNP  Dulaglutide (TRULICITY) 0.75 MG/0.5ML SOPN Inject 0.75 mg into the skin once a week. 02/11/20  Yes Just, Kelsea J, FNP  Elastic Bandages & Supports (MEDICAL COMPRESSION STOCKINGS) MISC Wear daily to relieve pain from varicose veins. I83.893 09/17/17  Yes Stallings, Zoe A, MD  glucose blood (IGLUCOSE TEST STRIPS) test strip Use as instructed 09/17/17  Yes Stallings, Zoe A, MD  Lancet Devices (LANCING DEVICE) MISC Use to check blood glucose once daily and as needed 09/17/17  Yes Stallings, Zoe A, MD  Lancets MISC Use to check blood glucose once daily and as needed. E11.9 09/17/17  Yes Stallings, Zoe A, MD  lisinopril (ZESTRIL) 10 MG tablet Take 1 tablet (10 mg total) by mouth daily. 06/14/19  Yes Santiago Lago, Irma M, MD  metFORMIN (GLUCOPHAGE) 500 MG tablet Take 1 tablet (500 mg total) by mouth 2 (two) times daily with a meal. 02/07/20  Yes Just, Kelsea J, FNP  canagliflozin (INVOKANA) 100 MG TABS tablet Take 1 tablet (100 mg total) by mouth daily before breakfast. Patient not taking: Reported on 04/03/2020 02/07/20   Just, Kelsea J, FNP  ondansetron (ZOFRAN) 4 MG tablet Take 1 tablet (4 mg total) by mouth every 8 (eight) hours as needed for nausea or vomiting. Patient not taking: Reported on 04/03/2020 02/10/20   Morrow, Richard, NP    Past Medical History:  Diagnosis Date  . Diabetes mellitus without complication (HCC)   . Hypercholesteremia   . Hypertension       Past Surgical History:  Procedure Laterality Date  . APPENDECTOMY      Social History   Tobacco Use  . Smoking status: Current Every Day Smoker    Packs/day: 0.50  . Smokeless tobacco: Never Used  Substance Use Topics  . Alcohol use: No    Family History  Problem Relation Age of Onset  . Stroke Mother   . Hypertension Sister   . Diabetes Sister   . Hypertension Brother   . Diabetes Brother     Review of Systems  Constitutional: Positive for malaise/fatigue. Negative for chills  and fever.  HENT: Positive for congestion and sore throat. Negative for sinus pain.   Respiratory: Positive for cough and sputum production. Negative for shortness of breath and wheezing.   Cardiovascular: Negative for palpitations.  Gastrointestinal: Negative for abdominal pain, diarrhea, nausea and vomiting.  Neurological: Negative for dizziness and headaches.    Objective  Constitutional:      General: Not in acute distress.    Appearance: Normal appearance. Not ill-appearing.   Pulmonary:     Effort: Pulmonary effort is normal. No respiratory distress.  Neurological:     Mental Status: Alert and oriented to person, place, and time.  Psychiatric:        Mood and Affect: Mood normal.        Behavior: Behavior normal.     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 Multiple abnormalities seen in Labs from ED: will recheck in clinic . Discussed r/se/b of medications . Continue OTC treatments and answered questions on covid symptom trajectory  Return in about 2 weeks (around 04/17/2020), or if symptoms worsen or fail to improve, for ED lab follow up nurse visit.    The above assessment and management plan was discussed with the patient. The patient verbalized understanding of and has agreed to the management plan. Patient  is aware to call the clinic if symptoms persist or worsen. Patient is aware when to return to the clinic for a follow-up visit. Patient educated on when it is appropriate to go to the emergency department.     Huston Foley Naylah Cork, FNP-BC [facility] [place], [place] [address] Ph.  [phone] Fax [phone]

## 2020-04-27 ENCOUNTER — Encounter: Payer: Self-pay | Admitting: Registered Nurse

## 2020-04-27 ENCOUNTER — Ambulatory Visit (INDEPENDENT_AMBULATORY_CARE_PROVIDER_SITE_OTHER): Payer: 59 | Admitting: Registered Nurse

## 2020-04-27 ENCOUNTER — Other Ambulatory Visit: Payer: Self-pay

## 2020-04-27 VITALS — BP 118/81 | HR 113 | Temp 98.0°F | Resp 18 | Ht 58.5 in | Wt 163.0 lb

## 2020-04-27 DIAGNOSIS — R9431 Abnormal electrocardiogram [ECG] [EKG]: Secondary | ICD-10-CM | POA: Diagnosis not present

## 2020-04-27 DIAGNOSIS — R42 Dizziness and giddiness: Secondary | ICD-10-CM

## 2020-04-27 DIAGNOSIS — R0602 Shortness of breath: Secondary | ICD-10-CM

## 2020-04-27 LAB — COMPREHENSIVE METABOLIC PANEL
ALT: 31 IU/L (ref 0–32)
AST: 26 IU/L (ref 0–40)
Albumin/Globulin Ratio: 1.5 (ref 1.2–2.2)
Albumin: 3.9 g/dL (ref 3.8–4.9)
Alkaline Phosphatase: 94 IU/L (ref 44–121)
BUN/Creatinine Ratio: 21 (ref 9–23)
BUN: 10 mg/dL (ref 6–24)
Bilirubin Total: 0.5 mg/dL (ref 0.0–1.2)
CO2: 26 mmol/L (ref 20–29)
Calcium: 9.8 mg/dL (ref 8.7–10.2)
Chloride: 105 mmol/L (ref 96–106)
Creatinine, Ser: 0.48 mg/dL — ABNORMAL LOW (ref 0.57–1.00)
GFR calc Af Amer: 127 mL/min/{1.73_m2} (ref >59)
GFR calc non Af Amer: 110 mL/min/{1.73_m2} (ref >59)
Globulin, Total: 2.6 g/dL (ref 1.5–4.5)
Glucose: 115 mg/dL — ABNORMAL HIGH (ref 65–99)
Potassium: 4.2 mmol/L (ref 3.5–5.2)
Sodium: 141 mmol/L (ref 134–144)
Total Protein: 6.5 g/dL (ref 6.0–8.5)

## 2020-04-27 MED ORDER — MECLIZINE HCL 12.5 MG PO TABS: 12.5000 mg | ORAL_TABLET | Freq: Two times a day (BID) | ORAL | 0 refills | Status: AC | PRN

## 2020-04-27 NOTE — Patient Instructions (Signed)
° ° ° °  If you have lab work done today you will be contacted with your lab results within the next 2 weeks.  If you have not heard from us then please contact us. The fastest way to get your results is to register for My Chart. ° ° °IF you received an x-ray today, you will receive an invoice from [facility]. Please contact [facility] at [phone] with questions or concerns regarding your invoice.  ° °IF you received labwork today, you will receive an invoice from [facility]. Please contact [facility] at [phone] with questions or concerns regarding your invoice.  ° °Our billing staff will not be able to assist you with questions regarding bills from these companies. ° °You will be contacted with the lab results as soon as they are available. The fastest way to get your results is to activate your My Chart account. Instructions are located on the last page of this paperwork. If you have not heard from us regarding the results in 2 weeks, please contact this office. °  ° ° ° °

## 2020-04-28 LAB — CBC WITH DIFFERENTIAL
Basophils Absolute: 0.1 10*3/uL (ref 0.0–0.2)
Basos: 1 %
EOS (ABSOLUTE): 0.2 10*3/uL (ref 0.0–0.4)
Eos: 3 %
Hematocrit: 43.3 % (ref 34.0–46.6)
Hemoglobin: 14.5 g/dL (ref 11.1–15.9)
Immature Grans (Abs): 0 10*3/uL (ref 0.0–0.1)
Immature Granulocytes: 0 %
Lymphocytes Absolute: 2.1 10*3/uL (ref 0.7–3.1)
Lymphs: 37 %
MCH: 28.3 pg (ref 26.6–33.0)
MCHC: 33.5 g/dL (ref 31.5–35.7)
MCV: 85 fL (ref 79–97)
Monocytes Absolute: 0.7 10*3/uL (ref 0.1–0.9)
Monocytes: 13 %
Neutrophils Absolute: 2.6 10*3/uL (ref 1.4–7.0)
Neutrophils: 46 %
RBC: 5.12 x10E6/uL (ref 3.77–5.28)
RDW: 13.5 % (ref 11.7–15.4)
WBC: 5.6 10*3/uL (ref 3.4–10.8)

## 2020-04-28 LAB — URINALYSIS
Bilirubin, UA: NEGATIVE
Glucose, UA: NEGATIVE
Ketones, UA: NEGATIVE
Nitrite, UA: NEGATIVE
Protein,UA: NEGATIVE
RBC, UA: NEGATIVE
Specific Gravity, UA: 1.012 (ref 1.005–1.030)
Urobilinogen, Ur: 1 mg/dL (ref 0.2–1.0)
pH, UA: 6 (ref 5.0–7.5)

## 2020-04-28 LAB — COMPREHENSIVE METABOLIC PANEL
ALT: 31 IU/L (ref 0–32)
AST: 27 IU/L (ref 0–40)
Albumin/Globulin Ratio: 1.4 (ref 1.2–2.2)
Albumin: 4 g/dL (ref 3.8–4.9)
Alkaline Phosphatase: 93 IU/L (ref 44–121)
BUN/Creatinine Ratio: 17 (ref 9–23)
BUN: 10 mg/dL (ref 6–24)
Bilirubin Total: 0.5 mg/dL (ref 0.0–1.2)
CO2: 22 mmol/L (ref 20–29)
Calcium: 9.8 mg/dL (ref 8.7–10.2)
Chloride: 106 mmol/L (ref 96–106)
Creatinine, Ser: 0.58 mg/dL (ref 0.57–1.00)
GFR calc Af Amer: 119 mL/min/{1.73_m2} (ref >59)
GFR calc non Af Amer: 103 mL/min/{1.73_m2} (ref >59)
Globulin, Total: 2.8 g/dL (ref 1.5–4.5)
Glucose: 111 mg/dL — ABNORMAL HIGH (ref 65–99)
Potassium: 4.2 mmol/L (ref 3.5–5.2)
Sodium: 145 mmol/L — ABNORMAL HIGH (ref 134–144)
Total Protein: 6.8 g/dL (ref 6.0–8.5)

## 2020-04-28 LAB — LIPID PANEL
Chol/HDL Ratio: 3.2 ratio (ref 0.0–4.4)
Cholesterol, Total: 120 mg/dL (ref 100–199)
HDL: 37 mg/dL — ABNORMAL LOW (ref >39)
LDL Chol Calc (NIH): 52 mg/dL (ref 0–99)
Triglycerides: 190 mg/dL — ABNORMAL HIGH (ref 0–149)
VLDL Cholesterol Cal: 31 mg/dL (ref 5–40)

## 2020-04-28 LAB — MAGNESIUM: Magnesium: 1.9 mg/dL (ref 1.6–2.3)

## 2020-04-29 LAB — URINE CULTURE: Organism ID, Bacteria: NO GROWTH

## 2020-04-30 NOTE — Progress Notes (Signed)
Hello,  Normal results letter, please.  Thanks,  Rich Jahlani Lorentz, NP 

## 2020-05-09 ENCOUNTER — Ambulatory Visit: Payer: Self-pay | Admitting: *Deleted

## 2020-05-09 NOTE — Telephone Encounter (Signed)
Please advise on message below and let me know if we need anything else.

## 2020-05-09 NOTE — Telephone Encounter (Signed)
Saw Tracy Sanders last week with vertigo. Prescribed Antivert 12.5 mg twice daily. Daughter takes care of her and reports she continues to have the dizziness/vertigo while taking the Antivert.  Daughter is concerned it may be the Trulicity that could be causing it and/or her ears feel full/stopped up-stated ears were not checked at last weeks visit. Daughter is wanting pcp's advice regarding the Trulicity and her ears. Care Advice including trying a different antihistamine other than Antivert. Also, start checking and logging cbg's for provider. Routing to pcp regarding Trulicity and if appointment preferred for ear symptom.   Reason for Disposition . [1] MODERATE dizziness (e.g., vertigo; feels very unsteady, interferes with normal activities) AND [2] has been evaluated by physician for this  Answer Assessment - Initial Assessment Questions 1. DESCRIPTION: "Describe your dizziness."     Woozy headed. 2. VERTIGO: "Do you feel like either you or the room is spinning or tilting?"      Yes 3. LIGHTHEADED: "Do you feel lightheaded?" (e.g., somewhat faint, woozy, weak upon standing)     Yes, room spins with standing and leaning over 4. SEVERITY: "How bad is it?"  "Can you walk?"   - MILD: Feels unsteady but walking normally.   - MODERATE: Feels very unsteady when walking, but not falling; interferes with normal activities (e.g., school, work) .   - SEVERE: Unable to walk without falling, or requires assistance to walk without falling.     Interferes with daily activities. 5. ONSET:  "When did the dizziness begin?"     Prior to OV on 04/27/20. 6. AGGRAVATING FACTORS: "Does anything make it worse?" (e.g., standing, change in head position)     All of these. 7. CAUSE: "What do you think is causing the dizziness?"     Unsure. Maybe Trulicity or ears feeling full 8. RECURRENT SYMPTOM: "Have you had dizziness before?" If Yes, ask: "When was the last time?" "What happened that time?"   Dizziness with vertigo  has continued with taking the meclizine  9. OTHER SYMPTOMS: "Do you have any other symptoms?" (e.g., headache, weakness, numbness, vomiting, earache)     Fatigue, ears feel full or stopped up 10. PREGNANCY: "Is there any chance you are pregnant?" "When was your last menstrual period?"       na  Protocols used: DIZZINESS - VERTIGO-A-AH

## 2020-09-11 LAB — COLOGUARD: COLOGUARD: NEGATIVE

## 2021-03-28 NOTE — Progress Notes (Signed)
Established Patient Office Visit  Subjective:  Patient ID: Tracy Sanders, female    DOB: 02-01-1966  Age: 56 y.o. MRN: [id number]  CC:  Chief Complaint  Patient presents with   Dizziness    Patient states she has been dizzy and weak so she went to the ER. Per patient she is constipated and didn't know she was in the hospital. Patient said mom jaw was locked and eyes were rolling in the back of her head.    HPI Tracy Sanders presents for dizziness  Acute onset dizziness, weakness. Brought by daughter to ER.  Noted to have gastroenteritis, UTI Tx for this with abx.  Today feeling better, still dizzy. Pt daughter states they were asked to follow up   No acute concerns or worsening symptoms at this time.  Past Medical History:  Diagnosis Date   Diabetes mellitus without complication ([place])    Hypercholesteremia    Hypertension     Past Surgical History:  Procedure Laterality Date   APPENDECTOMY      Family History  Problem Relation Age of Onset   Stroke Mother    Hypertension Sister    Diabetes Sister    Hypertension Brother    Diabetes Brother     Social History   Socioeconomic History   Marital status: Married    Spouse name: Not on file   Number of children: Not on file   Years of education: Not on file   Highest education level: Not on file  Occupational History   Not on file  Tobacco Use   Smoking status: Every Day    Packs/day: 0.50    Types: Cigarettes   Smokeless tobacco: Never  Vaping Use   Vaping Use: Never used  Substance and Sexual Activity   Alcohol use: No   Drug use: No   Sexual activity: Yes  Other Topics Concern   Not on file  Social History Narrative   Not on file   Social Determinants of Health   Financial Resource Strain: Not on file  Food Insecurity: Not on file  Transportation Needs: Not on file  Physical Activity: Not on file  Stress: Not on file  Social Connections: Not on file  Intimate Partner Violence: Not on  file    Outpatient Medications Prior to Visit  Medication Sig Dispense Refill   atorvastatin (LIPITOR) 80 MG tablet Take 1 tablet (80 mg total) by mouth daily. 90 tablet 3   benzonatate (TESSALON) 100 MG capsule Take 1-2 capsules (100-200 mg total) by mouth 3 (three) times daily as needed for cough. 40 capsule 0   blood glucose meter kit and supplies Dispense based on patient and insurance preference. Use up to four times daily as directed. (FOR ICD-10 E10.9, E11.9). 1 each 0   canagliflozin (INVOKANA) 100 MG TABS tablet Take 1 tablet (100 mg total) by mouth daily before breakfast. 30 tablet 6   diclofenac Sodium (VOLTAREN) 1 % GEL Apply 2 g topically 4 (four) times daily. 50 g 3   Dulaglutide (TRULICITY) 4.17 EY/8.1KG SOPN Inject 0.75 mg into the skin once a week. 0.5 mL 8   Elastic Bandages & Supports (MEDICAL COMPRESSION STOCKINGS) MISC Wear daily to relieve pain from varicose veins. Y18.563 2 each 1   glucose blood (IGLUCOSE TEST STRIPS) test strip Use as instructed 100 each 12   Lancet Devices (LANCING DEVICE) MISC Use to check blood glucose once daily and as needed 1 each 1   Lancets MISC Use to check  blood glucose once daily and as needed. E11.9 100 each 6   lisinopril (ZESTRIL) 10 MG tablet Take 1 tablet (10 mg total) by mouth daily. 90 tablet 0   metFORMIN (GLUCOPHAGE) 500 MG tablet Take 1 tablet (500 mg total) by mouth 2 (two) times daily with a meal. 90 tablet 1   ondansetron (ZOFRAN) 4 MG tablet Take 1 tablet (4 mg total) by mouth every 8 (eight) hours as needed for nausea or vomiting. 20 tablet 0   No facility-administered medications prior to visit.    Allergies  Allergen Reactions   Other     "a cold medicine"  - not sure which one    ROS Review of Systems Per hpi     Objective:    Physical Exam Vitals and nursing note reviewed.  Constitutional:      General: She is not in acute distress.    Appearance: Normal appearance. She is normal weight. She is not  ill-appearing, toxic-appearing or diaphoretic.  Cardiovascular:     Rate and Rhythm: Normal rate and regular rhythm.     Heart sounds: Normal heart sounds. No murmur heard.   No friction rub. No gallop.  Pulmonary:     Effort: Pulmonary effort is normal. No respiratory distress.     Breath sounds: Normal breath sounds. No stridor. No wheezing, rhonchi or rales.  Chest:     Chest wall: No tenderness.  Skin:    General: Skin is warm and dry.  Neurological:     General: No focal deficit present.     Mental Status: She is alert and oriented to person, place, and time. Mental status is at baseline.  Psychiatric:        Mood and Affect: Mood normal.        Behavior: Behavior normal.        Thought Content: Thought content normal.        Judgment: Judgment normal.    BP 118/81    Pulse (!) 113    Temp 98 F (36.7 C) (Temporal)    Resp 18    Ht 4' 10.5" (1.486 m)    Wt 163 lb (73.9 kg)    SpO2 97%    BMI 33.49 kg/m  Wt Readings from Last 3 Encounters:  04/27/20 163 lb (73.9 kg)  02/06/20 173 lb (78.5 kg)  07/13/19 177 lb 6.4 oz (80.5 kg)     Health Maintenance Due  Topic Date Due   COVID-19 Vaccine (1) Never done   COLONOSCOPY (Pts 45-62yr Insurance coverage will need to be confirmed)  Never done   MAMMOGRAM  06/22/2013   Zoster Vaccines- Shingrix (1 of 2) Never done   Pneumococcal Vaccine 11690Years old (2 - PCV) 09/18/2018   PAP SMEAR-Modifier  12/19/2019   FOOT EXAM  06/13/2020   OPHTHALMOLOGY EXAM  07/11/2020   HEMOGLOBIN A1C  08/05/2020   INFLUENZA VACCINE  Never done    There are no preventive care reminders to display for this patient.  Lab Results  Component Value Date   TSH 5.130 (H) 12/22/2016   Lab Results  Component Value Date   WBC 5.6 04/27/2020   HGB 14.5 04/27/2020   HCT 43.3 04/27/2020   MCV 85 04/27/2020   PLT 88 (L) 04/01/2020   Lab Results  Component Value Date   NA 145 (H) 04/27/2020   K 4.2 04/27/2020   CO2 22 04/27/2020   GLUCOSE 111  (H) 04/27/2020   BUN 10 04/27/2020  CREATININE 0.58 04/27/2020   BILITOT 0.5 04/27/2020   ALKPHOS 93 04/27/2020   AST 27 04/27/2020   ALT 31 04/27/2020   PROT 6.8 04/27/2020   ALBUMIN 4.0 04/27/2020   CALCIUM 9.8 04/27/2020   ANIONGAP 11 04/01/2020   Lab Results  Component Value Date   CHOL 120 04/27/2020   Lab Results  Component Value Date   HDL 37 (L) 04/27/2020   Lab Results  Component Value Date   LDLCALC 52 04/27/2020   Lab Results  Component Value Date   TRIG 190 (H) 04/27/2020   Lab Results  Component Value Date   CHOLHDL 3.2 04/27/2020   Lab Results  Component Value Date   HGBA1C 12.2 (H) 02/06/2020      Assessment & Plan:   Problem List Items Addressed This Visit   None Visit Diagnoses     Dizziness    -  Primary   Relevant Medications   meclizine (ANTIVERT) 12.5 MG tablet   Other Relevant Orders   EKG 12-Lead   Comprehensive metabolic panel (Completed)   CBC With Differential (Completed)   Urinalysis (Completed)   Urine Culture (Completed)   Lipid Panel (Completed)   Magnesium   Shortness of breath       Relevant Medications   meclizine (ANTIVERT) 12.5 MG tablet   Other Relevant Orders   EKG 12-Lead   Comprehensive metabolic panel (Completed)   CBC With Differential (Completed)   Urinalysis (Completed)   Urine Culture (Completed)   Lipid Panel (Completed)   Nonspecific abnormal electrocardiogram (ECG) (EKG)       Relevant Medications   meclizine (ANTIVERT) 12.5 MG tablet   Other Relevant Orders   Comprehensive metabolic panel (Completed)   CBC With Differential (Completed)   Urinalysis (Completed)   Urine Culture (Completed)   Lipid Panel (Completed)       Meds ordered this encounter  Medications   meclizine (ANTIVERT) 12.5 MG tablet    Sig: Take 1 tablet (12.5 mg total) by mouth 2 (two) times daily as needed for dizziness.    Dispense:  30 tablet    Refill:  0    Order Specific Question:   Supervising Provider    Answer:    Carlota Raspberry, JEFFREY R [[id number]]    Follow-up: No follow-ups on file.   PLAN Can try meclizine for ongoing dizziness. EKG taken today. Compared to reading from 08/17/2017. No acute changes noted.  Labs collected. Will follow up with the patient as warranted. Patient encouraged to call clinic with any questions, comments, or concerns.  Maximiano Coss, NP

## 2021-04-21 IMAGING — DX DG CHEST 1V PORT
1 series · 1 of 1 positions shown · non-contrast
Comparison: Chest radiograph dated 09/24/2016.

CLINICAL DATA: 54-year-old female with cough.

EXAM:
PORTABLE CHEST 1 VIEW

[chest]
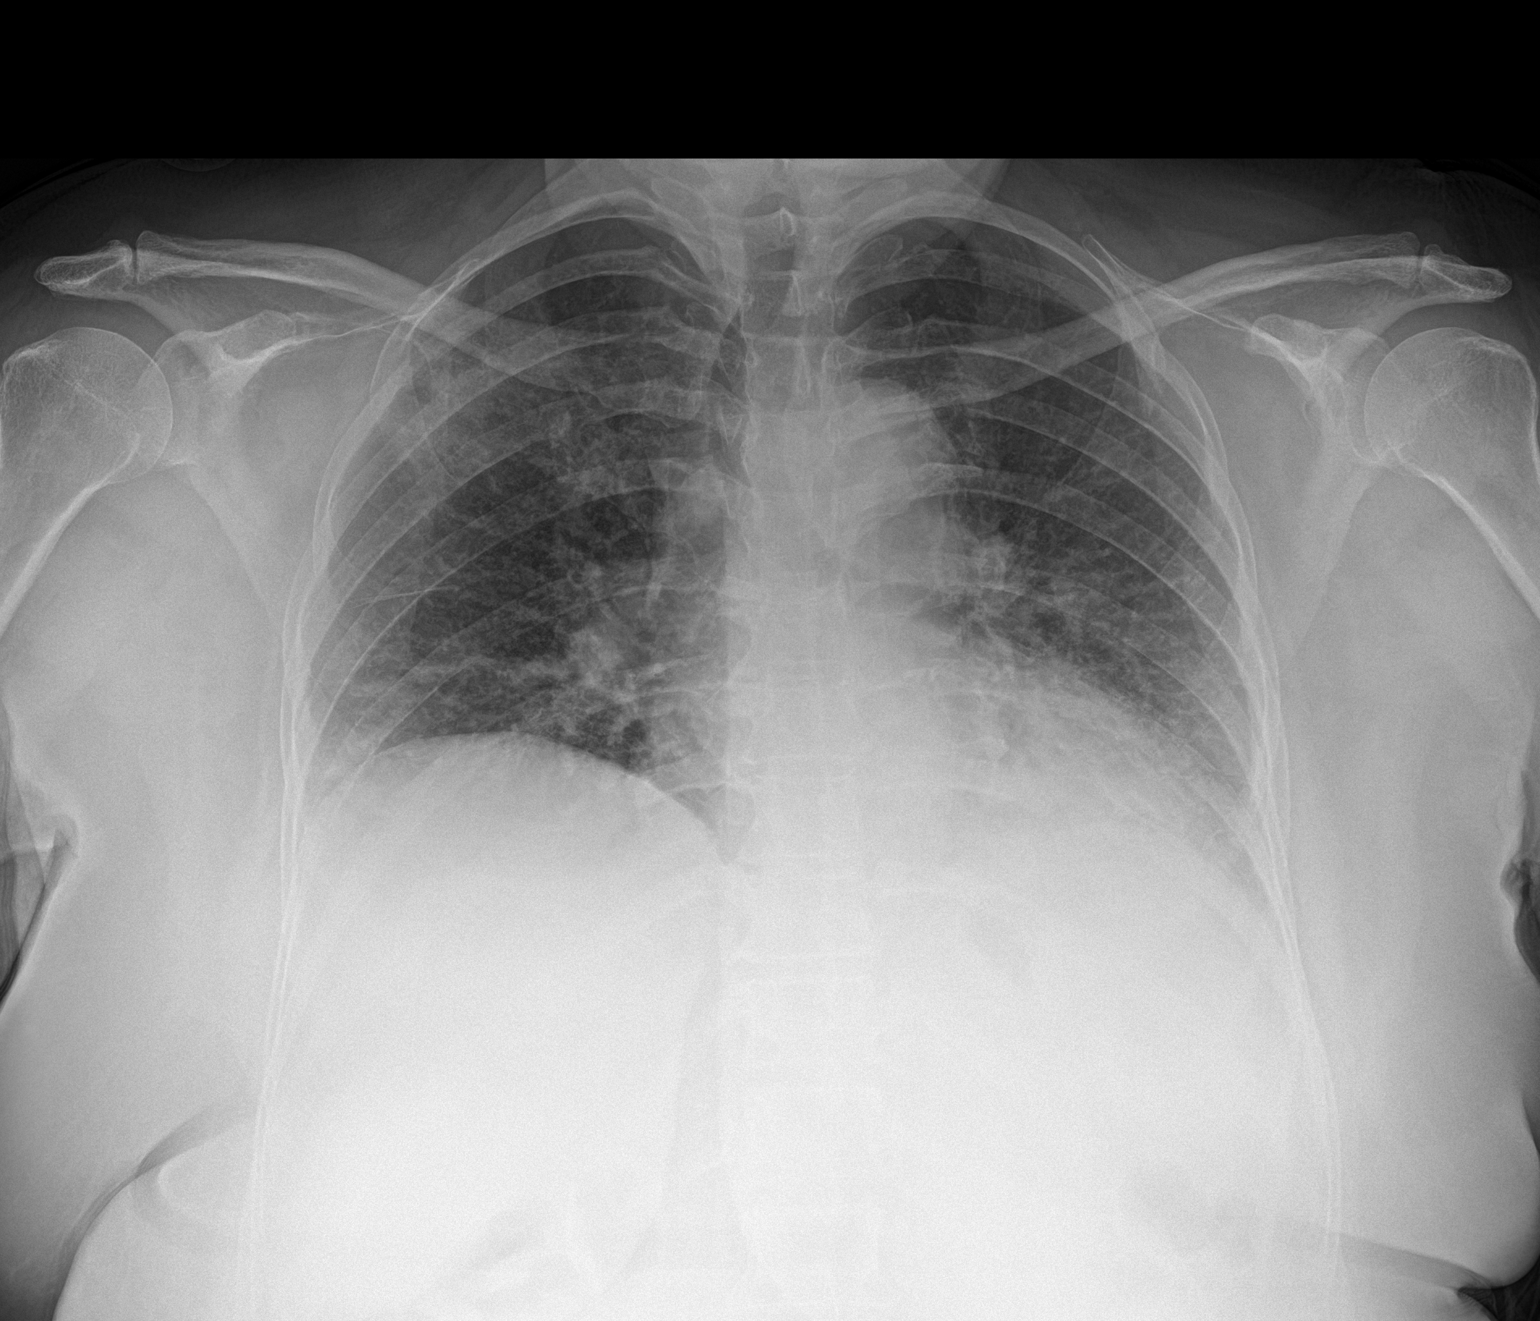

[1 of 1 positions shown; findings below may reference images not displayed]

FINDINGS: Bilateral interstitial streaky densities as well as a faint focal
area of airspace opacity involving the right upper lobe and left
lower lobe most consistent with multifocal pneumonia, likely viral
or atypical in etiology. Clinical correlation recommended. No
pleural effusion pneumothorax. Stable cardiac silhouette. No acute
osseous pathology.
IMPRESSION: Multifocal pneumonia. Clinical correlation and follow-up
recommended.

## 2024-03-16 ENCOUNTER — Other Ambulatory Visit: Payer: Self-pay

## 2024-03-16 ENCOUNTER — Emergency Department (HOSPITAL_BASED_OUTPATIENT_CLINIC_OR_DEPARTMENT_OTHER)

## 2024-03-16 ENCOUNTER — Emergency Department (HOSPITAL_BASED_OUTPATIENT_CLINIC_OR_DEPARTMENT_OTHER): Admitting: Radiology

## 2024-03-16 ENCOUNTER — Emergency Department (HOSPITAL_BASED_OUTPATIENT_CLINIC_OR_DEPARTMENT_OTHER)
Admission: EM | Admit: 2024-03-16 | Discharge: 2024-03-16 | Discharge status: Against Medical Advice | Attending: Emergency Medicine | Admitting: Emergency Medicine

## 2024-03-16 DIAGNOSIS — R079 Chest pain, unspecified: Secondary | ICD-10-CM | POA: Insufficient documentation

## 2024-03-16 DIAGNOSIS — R6889 Other general symptoms and signs: Secondary | ICD-10-CM

## 2024-03-16 DIAGNOSIS — E119 Type 2 diabetes mellitus without complications: Secondary | ICD-10-CM | POA: Diagnosis not present

## 2024-03-16 DIAGNOSIS — Z5329 Procedure and treatment not carried out because of patient's decision for other reasons: Secondary | ICD-10-CM | POA: Insufficient documentation

## 2024-03-16 DIAGNOSIS — I10 Essential (primary) hypertension: Secondary | ICD-10-CM | POA: Diagnosis not present

## 2024-03-16 DIAGNOSIS — R062 Wheezing: Secondary | ICD-10-CM | POA: Diagnosis not present

## 2024-03-16 LAB — URINALYSIS, W/ REFLEX TO CULTURE (INFECTION SUSPECTED)
Bilirubin Urine: NEGATIVE
Glucose, UA: NEGATIVE mg/dL
Hgb urine dipstick: NEGATIVE
Ketones, ur: NEGATIVE mg/dL
Leukocytes,Ua: NEGATIVE
Nitrite: NEGATIVE
Protein, ur: NEGATIVE mg/dL
Specific Gravity, Urine: 1.015 (ref 1.005–1.030)
pH: 5.5 (ref 5.0–8.0)

## 2024-03-16 LAB — RESP PANEL BY RT-PCR (RSV, FLU A&B, COVID)  RVPGX2
Influenza A by PCR: NEGATIVE
Influenza B by PCR: NEGATIVE
Resp Syncytial Virus by PCR: NEGATIVE
SARS Coronavirus 2 by RT PCR: NEGATIVE

## 2024-03-16 LAB — BASIC METABOLIC PANEL WITH GFR
Anion gap: 15 (ref 5–15)
BUN: 10 mg/dL (ref 6–20)
CO2: 23 mmol/L (ref 22–32)
Calcium: 10.1 mg/dL (ref 8.9–10.3)
Chloride: 101 mmol/L (ref 98–111)
Creatinine, Ser: 0.6 mg/dL (ref 0.44–1.00)
GFR, Estimated: >60 mL/min
Glucose, Bld: 268 mg/dL — ABNORMAL HIGH (ref 70–99)
Potassium: 3.8 mmol/L (ref 3.5–5.1)
Sodium: 139 mmol/L (ref 135–145)

## 2024-03-16 LAB — CBC
HCT: 41.2 % (ref 36.0–46.0)
Hemoglobin: 14.1 g/dL (ref 12.0–15.0)
MCH: 27.7 pg (ref 26.0–34.0)
MCHC: 34.2 g/dL (ref 30.0–36.0)
MCV: 80.9 fL (ref 80.0–100.0)
Platelets: 212 K/uL (ref 150–400)
RBC: 5.09 MIL/uL (ref 3.87–5.11)
RDW: 13.2 % (ref 11.5–15.5)
WBC: 8.5 K/uL (ref 4.0–10.5)
nRBC: 0 % (ref 0.0–0.2)

## 2024-03-16 LAB — HEPATIC FUNCTION PANEL
ALT: 43 U/L (ref 0–44)
AST: 40 U/L (ref 15–41)
Albumin: 4.2 g/dL (ref 3.5–5.0)
Alkaline Phosphatase: 70 U/L (ref 38–126)
Bilirubin, Direct: <0.1 mg/dL (ref 0.0–0.2)
Total Bilirubin: 0.3 mg/dL (ref 0.0–1.2)
Total Protein: 7.4 g/dL (ref 6.5–8.1)

## 2024-03-16 LAB — TROPONIN T, HIGH SENSITIVITY
Troponin T High Sensitivity: 70 ng/L — ABNORMAL HIGH (ref 0–19)
Troponin T High Sensitivity: 69 ng/L — ABNORMAL HIGH (ref 0–19)

## 2024-03-16 LAB — LIPASE, BLOOD: Lipase: 45 U/L (ref 11–51)

## 2024-03-16 LAB — LACTIC ACID, PLASMA
Lactic Acid, Venous: 3.3 mmol/L (ref 0.5–1.9)
Lactic Acid, Venous: 1.7 mmol/L (ref 0.5–1.9)

## 2024-03-16 MED ORDER — IOHEXOL 350 MG/ML SOLN
100.0000 mL | Freq: Once | INTRAVENOUS | Status: AC | PRN
  Administered 2024-03-16: 100 mL via INTRAVENOUS

## 2024-03-16 MED ORDER — MORPHINE SULFATE (PF) 4 MG/ML IV SOLN
4.0000 mg | Freq: Once | INTRAVENOUS | Status: AC
  Administered 2024-03-16: 4 mg via INTRAVENOUS
  Filled 2024-03-16: qty 1

## 2024-03-16 MED ORDER — SODIUM CHLORIDE 0.9 % IV BOLUS
1000.0000 mL | Freq: Once | INTRAVENOUS | Status: AC
  Administered 2024-03-16: 1000 mL via INTRAVENOUS

## 2024-03-16 MED ORDER — IPRATROPIUM-ALBUTEROL 0.5-2.5 (3) MG/3ML IN SOLN
3.0000 mL | Freq: Once | RESPIRATORY_TRACT | Status: AC
  Administered 2024-03-16: 3 mL via RESPIRATORY_TRACT
  Filled 2024-03-16: qty 3

## 2024-03-16 MED ORDER — ONDANSETRON HCL 4 MG/2ML IJ SOLN
4.0000 mg | Freq: Once | INTRAMUSCULAR | Status: AC
  Administered 2024-03-16: 4 mg via INTRAVENOUS
  Filled 2024-03-16: qty 2

## 2024-03-16 NOTE — Progress Notes (Signed)
 Plan of Care Note for accepted transfer   Patient: Tracy Sanders MRN: [id number]   DOA: 03/16/2024  Facility requesting transfer: [facility]  Requesting Provider: Norleen Essex, PA   Reason for transfer: Chest pain   Facility course: 59 yr old female with HTN, HLD, DM, and hypothyroidism presents with cough, N/V/D, and chest pain.   She reports 2-3 weeks of cough, malaise, N/V/D, and chest pain.   She was afebrile and mildly tachycardic. Labs are most notable for troponin 70 then 69, normal WBC, lactate 3.3, and negative COVID/Flu/RSV. There are no acute findings on CTA chest or CT abdomen/pelvis.   Cardiology (Dr. Verlin) was consulted by the ED PA and recommended medical admission.   Plan of care: The patient is accepted for admission to Telemetry unit, at [facility]   Author: Evalene GORMAN Sprinkles, MD 03/16/2024  Check [email or web address] for on-call coverage.  Nursing staff, Please call [facility] Admits & Consults System-Wide number on Amion as soon as patient's arrival, so appropriate admitting provider can evaluate the pt.

## 2024-03-16 NOTE — ED Notes (Signed)
 2nd lactic will be drawn after second bag of fluids.

## 2024-03-16 NOTE — ED Provider Notes (Signed)
 " [place] EMERGENCY DEPARTMENT AT [facility] Provider Note   CSN: [id number] Arrival date & time: 03/16/24  1341     Patient presents with: Chest Pain  HPI Tracy Sanders is a 59 y.o. female with type 2 diabetes, hyperlipidemia, HTN, s/p appendectomy presenting for cough nausea diarrhea and vomiting.  Symptoms have been on and off for 2 to 3 weeks but worse in the last few days.  She also reports that multiple members of her family have had similar symptoms as well.  Her daughter also mentioned that she has not been taking her insulin while she has been sick but her CBGs at home have been better than normal.  She denies urinary symptoms.  Denies bloody output and fever.    Chest Pain      Prior to Admission medications  Medication Sig Start Date End Date Taking? Authorizing Provider  atorvastatin  (LIPITOR) 80 MG tablet Take 1 tablet (80 mg total) by mouth daily. 06/14/19   Melonie Colonel, Mikel HERO, MD  benzonatate  (TESSALON ) 100 MG capsule Take 1-2 capsules (100-200 mg total) by mouth 3 (three) times daily as needed for cough. 04/03/20   Just, Kelsea J, FNP  blood glucose meter kit and supplies Dispense based on patient and insurance preference. Use up to four times daily as directed. (FOR ICD-10 E10.9, E11.9). 09/17/17   Stallings, Zoe A, MD  canagliflozin  (INVOKANA ) 100 MG TABS tablet Take 1 tablet (100 mg total) by mouth daily before breakfast. 02/07/20   Just, Kelsea J, FNP  diclofenac  Sodium (VOLTAREN ) 1 % GEL Apply 2 g topically 4 (four) times daily. 02/06/20   Just, Kelsea J, FNP  Dulaglutide  (TRULICITY ) 0.75 MG/0.5ML SOPN Inject 0.75 mg into the skin once a week. 02/11/20   Just, Kelsea J, FNP  Elastic Bandages & Supports (MEDICAL COMPRESSION STOCKINGS) MISC Wear daily to relieve pain from varicose veins. P16.106 09/17/17   Bettie Banks A, MD  glucose blood (IGLUCOSE TEST STRIPS) test strip Use as instructed 09/17/17   Stallings, Zoe A, MD  Lancet Devices (LANCING DEVICE)  MISC Use to check blood glucose once daily and as needed 09/17/17   Stallings, Zoe A, MD  Lancets MISC Use to check blood glucose once daily and as needed. E11.9 09/17/17   Stallings, Zoe A, MD  lisinopril  (ZESTRIL ) 10 MG tablet Take 1 tablet (10 mg total) by mouth daily. 04/03/20   Just, Kelsea J, FNP  lisinopril  (ZESTRIL ) 20 MG tablet Take 20 mg by mouth daily.    [provider]  meclizine  (ANTIVERT ) 12.5 MG tablet Take 1 tablet (12.5 mg total) by mouth 2 (two) times daily as needed for dizziness. 04/27/20   Kip Ade, NP  metFORMIN  (GLUCOPHAGE ) 500 MG tablet Take 1 tablet (500 mg total) by mouth 2 (two) times daily with a meal. 04/03/20   Just, Kelsea J, FNP  ondansetron  (ZOFRAN ) 4 MG tablet Take 1 tablet (4 mg total) by mouth every 8 (eight) hours as needed for nausea or vomiting. 02/10/20   Kip Ade, NP    Allergies: Other    Review of Systems  Cardiovascular:  Positive for chest pain.    Updated Vital Signs BP (!) 159/88   Pulse 96   Temp 98.1 F (36.7 C)   Resp 18   LMP 09/14/2017   SpO2 94%   Physical Exam Vitals and nursing note reviewed.  HENT:     Head: Normocephalic and atraumatic.     Mouth/Throat:     Mouth: Mucous  membranes are moist.  Eyes:     General:        Right eye: No discharge.        Left eye: No discharge.     Conjunctiva/sclera: Conjunctivae normal.  Cardiovascular:     Rate and Rhythm: Regular rhythm. Tachycardia present.     Pulses: Normal pulses.     Heart sounds: Normal heart sounds.  Pulmonary:     Effort: Pulmonary effort is normal.     Breath sounds: Examination of the right-upper field reveals wheezing. Examination of the left-upper field reveals wheezing. Examination of the right-middle field reveals wheezing. Examination of the left-middle field reveals wheezing. Examination of the right-lower field reveals wheezing. Examination of the left-lower field reveals wheezing. Wheezing present. No rhonchi or rales.  Abdominal:      General: Abdomen is flat.     Palpations: Abdomen is soft.     Tenderness: There is generalized abdominal tenderness.  Skin:    General: Skin is warm and dry.  Neurological:     General: No focal deficit present.  Psychiatric:        Mood and Affect: Mood normal.     (all labs ordered are listed, but only abnormal results are displayed) Labs Reviewed  BASIC METABOLIC PANEL WITH GFR - Abnormal; Notable for the following components:      Result Value   Glucose, Bld 268 (*)    All other components within normal limits  LACTIC ACID, PLASMA - Abnormal; Notable for the following components:   Lactic Acid, Venous 3.3 (*)    All other components within normal limits  URINALYSIS, W/ REFLEX TO CULTURE (INFECTION SUSPECTED) - Abnormal; Notable for the following components:   Color, Urine STRAW (*)    Bacteria, UA MANY (*)    All other components within normal limits  TROPONIN T, HIGH SENSITIVITY - Abnormal; Notable for the following components:   Troponin T High Sensitivity 70 (*)    All other components within normal limits  TROPONIN T, HIGH SENSITIVITY - Abnormal; Notable for the following components:   Troponin T High Sensitivity 69 (*)    All other components within normal limits  RESP PANEL BY RT-PCR (RSV, FLU A&B, COVID)  RVPGX2  CBC  HEPATIC FUNCTION PANEL  LACTIC ACID, PLASMA  LIPASE, BLOOD    EKG: None  Radiology: CT Angio Chest PE W/Cm &/Or Wo Cm Result Date: 03/16/2024 CLINICAL DATA:  Chest and low back pain with cough and flu-like symptoms as well as nausea and diarrhea 2-3 weeks. Fever. EXAM: CT ANGIOGRAPHY CHEST CT ABDOMEN AND PELVIS WITH CONTRAST TECHNIQUE: Multidetector CT imaging of the chest was performed using the standard protocol during bolus administration of intravenous contrast. Multiplanar CT image reconstructions and MIPs were obtained to evaluate the vascular anatomy. Multidetector CT imaging of the abdomen and pelvis was performed using the standard  protocol during bolus administration of intravenous contrast. RADIATION DOSE REDUCTION: This exam was performed according to the departmental dose-optimization program which includes automated exposure control, adjustment of the mA and/or kV according to patient size and/or use of iterative reconstruction technique. CONTRAST:  OMNIPAQUE  IOHEXOL  350 MG/ML SOLN COMPARISON:  None Available. FINDINGS: CTA CHEST FINDINGS Cardiovascular: Mild cardiomegaly. Calcified plaque over the left main and 3 vessel coronary arteries. Thoracic aorta is normal in caliber. Minimal calcified plaque over the descending thoracic aorta. Pulmonary arterial system is well opacified without evidence of emboli. Mediastinum/Nodes: No significant mediastinal or hilar adenopathy. Remaining mediastinal structures are unremarkable. Lungs/Pleura:  Lungs are adequately inflated without focal airspace consolidation or effusion. Minimal linear atelectasis/scarring over the lingula. Airways are normal. Musculoskeletal: No focal abnormality. Review of the MIP images confirms the above findings. CT ABDOMEN and PELVIS FINDINGS Hepatobiliary: Gallbladder, liver and biliary tree are normal. Pancreas: Normal. Spleen: Normal. Adrenals/Urinary Tract: Adrenal glands are normal. Kidneys normal in size without hydronephrosis or nephrolithiasis. Ureters and bladder are normal. Stomach/Bowel: Stomach and small bowel are normal. Previous appendectomy. Mild diverticulosis of the colon without active inflammation. Vascular/Lymphatic: Minimal calcified plaque over the abdominal aorta which is normal caliber. Remaining vascular structures are unremarkable. No adenopathy. Reproductive: Uterus and bilateral adnexa are unremarkable. Other: No free peritoneal fluid or focal inflammatory change. Musculoskeletal: No focal abnormality. Review of the MIP images confirms the above findings. IMPRESSION: 1. No evidence of pulmonary embolism. 2. No acute findings in the chest,  abdomen or pelvis. 3. Mild cardiomegaly. Atherosclerotic coronary artery disease. 4. Mild colonic diverticulosis without active inflammation. 5. Aortic atherosclerosis. Aortic Atherosclerosis (ICD10-I70.0). Electronically Signed   By: Toribio Agreste M.D.   On: 03/16/2024 17:03   CT ABDOMEN PELVIS W CONTRAST Result Date: 03/16/2024 CLINICAL DATA:  Chest and low back pain with cough and flu-like symptoms as well as nausea and diarrhea 2-3 weeks. Fever. EXAM: CT ANGIOGRAPHY CHEST CT ABDOMEN AND PELVIS WITH CONTRAST TECHNIQUE: Multidetector CT imaging of the chest was performed using the standard protocol during bolus administration of intravenous contrast. Multiplanar CT image reconstructions and MIPs were obtained to evaluate the vascular anatomy. Multidetector CT imaging of the abdomen and pelvis was performed using the standard protocol during bolus administration of intravenous contrast. RADIATION DOSE REDUCTION: This exam was performed according to the departmental dose-optimization program which includes automated exposure control, adjustment of the mA and/or kV according to patient size and/or use of iterative reconstruction technique. CONTRAST:  OMNIPAQUE  IOHEXOL  350 MG/ML SOLN COMPARISON:  None Available. FINDINGS: CTA CHEST FINDINGS Cardiovascular: Mild cardiomegaly. Calcified plaque over the left main and 3 vessel coronary arteries. Thoracic aorta is normal in caliber. Minimal calcified plaque over the descending thoracic aorta. Pulmonary arterial system is well opacified without evidence of emboli. Mediastinum/Nodes: No significant mediastinal or hilar adenopathy. Remaining mediastinal structures are unremarkable. Lungs/Pleura: Lungs are adequately inflated without focal airspace consolidation or effusion. Minimal linear atelectasis/scarring over the lingula. Airways are normal. Musculoskeletal: No focal abnormality. Review of the MIP images confirms the above findings. CT ABDOMEN and PELVIS FINDINGS  Hepatobiliary: Gallbladder, liver and biliary tree are normal. Pancreas: Normal. Spleen: Normal. Adrenals/Urinary Tract: Adrenal glands are normal. Kidneys normal in size without hydronephrosis or nephrolithiasis. Ureters and bladder are normal. Stomach/Bowel: Stomach and small bowel are normal. Previous appendectomy. Mild diverticulosis of the colon without active inflammation. Vascular/Lymphatic: Minimal calcified plaque over the abdominal aorta which is normal caliber. Remaining vascular structures are unremarkable. No adenopathy. Reproductive: Uterus and bilateral adnexa are unremarkable. Other: No free peritoneal fluid or focal inflammatory change. Musculoskeletal: No focal abnormality. Review of the MIP images confirms the above findings. IMPRESSION: 1. No evidence of pulmonary embolism. 2. No acute findings in the chest, abdomen or pelvis. 3. Mild cardiomegaly. Atherosclerotic coronary artery disease. 4. Mild colonic diverticulosis without active inflammation. 5. Aortic atherosclerosis. Aortic Atherosclerosis (ICD10-I70.0). Electronically Signed   By: Toribio Agreste M.D.   On: 03/16/2024 17:03   DG Chest 2 View Result Date: 03/16/2024 CLINICAL DATA:  Cough and chest pain. EXAM: CHEST - 2 VIEW COMPARISON:  04/01/2020 FINDINGS: Lungs are hypoinflated without focal airspace consolidation or effusion. Cardiomediastinal  silhouette and remainder of the exam is unchanged. IMPRESSION: Hypoinflation without acute cardiopulmonary disease. Electronically Signed   By: Toribio Agreste M.D.   On: 03/16/2024 15:08     Procedures   Medications Ordered in the ED  sodium chloride  0.9 % bolus 1,000 mL (0 mLs Intravenous Stopped 03/16/24 1745)  ondansetron  (ZOFRAN ) injection 4 mg (4 mg Intravenous Given 03/16/24 1623)  morphine  (PF) 4 MG/ML injection 4 mg (4 mg Intravenous Given 03/16/24 1623)  ipratropium-albuterol  (DUONEB) 0.5-2.5 (3) MG/3ML nebulizer solution 3 mL (3 mLs Nebulization Given 03/16/24 1637)   ipratropium-albuterol  (DUONEB) 0.5-2.5 (3) MG/3ML nebulizer solution 3 mL (3 mLs Nebulization Given 03/16/24 1637)  iohexol  (OMNIPAQUE ) 350 MG/ML injection 100 mL (100 mLs Intravenous Contrast Given 03/16/24 1644)  sodium chloride  0.9 % bolus 1,000 mL (0 mLs Intravenous Stopped 03/16/24 1908)    Clinical Course as of 03/16/24 1958  Wed Mar 16, 2024  1753 Discussed with Dr. Verlin [JR]    Clinical Course User Index [JR] Lang Norleen POUR, PA-C                                 Medical Decision Making Amount and/or Complexity of Data Reviewed Labs: ordered. Radiology: ordered.  Risk Prescription drug management. Decision regarding hospitalization.   Initial Impression and Ddx 59 year old well-appearing female presenting for flulike symptoms, chest pain and abdominal pain.  Exam notable for generalized abdominal tenderness, tachycardia and diffuse wheezing.  DDx includes PE, COPD exacerbation, ACS, intra-abdominal infection, dissection, electrolyte derangement, sepsis, other. Patient PMH that increases complexity of ED encounter:  type 2 diabetes, hyperlipidemia, HTN, s/p appendectomy  Interpretation of Diagnostics I independent reviewed and interpreted the labs as followed: Troponin x 2 ~70 flat, BG 268  - I independently visualized the following imaging with scope of interpretation limited to determining acute life threatening conditions related to emergency care: Chest x-ray, CT angio of the chest, CT abdomen pelvis, which revealed no acute findings  -I personally reviewed interpreted EKG which revealed sinus tachycardia  Patient Reassessment and Ultimate Disposition/Management On reassessment symptoms improved overall, heart rate normalizing.  Patient however does have risk factors for ACS and troponin elevated to 70 x 2.  Discussed patient with Dr. Verlin of cardiology who advised admission to medicine and observation and to continue trending troponins and that cardiology could be  available if needed.  Suspect this could be demand ischemia in setting of viral illness but patient does have risk factors for ACS and feel that she would benefit from further evaluation.  Admitted to hospital service with Dr. Charlton.  Patient management required discussion with the following services or consulting groups:  Hospitalist Service and Cardiology  Complexity of Problems Addressed Acute complicated illness or Injury  Additional Data Reviewed and Analyzed Further history obtained from: Past medical history and medications listed in the EMR and Prior ED visit notes  Patient Encounter Risk Assessment Consideration of hospitalization      Final diagnoses:  Chest pain, unspecified type  Flu-like symptoms    ED Discharge Orders     None          Lang Norleen POUR, PA-C 03/16/24 1958    Dreama Longs, MD 04/03/24 [phone]  "

## 2024-03-16 NOTE — ED Triage Notes (Signed)
 Pt reports chest and lower back pain with cough and flu-like symptoms including aches, nausea and diarrhea for 2-3 weeks, with fever a few days ago, no fever today.  Back pain started many weeks ago.  Pt has not been taking her insulin while sick but per daughter, they have been checking her cbg's at home and they have been better than normal.   AAOx4 in triage, NAD at this time.

## 2024-04-10 ENCOUNTER — Emergency Department (HOSPITAL_COMMUNITY): Payer: Self-pay

## 2024-04-10 ENCOUNTER — Inpatient Hospital Stay (HOSPITAL_COMMUNITY)
Admission: EM | Admit: 2024-04-10 | Discharge: 2024-04-14 | Disposition: A | Payer: Self-pay | Source: Home / Self Care | Attending: Internal Medicine | Admitting: Internal Medicine

## 2024-04-10 ENCOUNTER — Encounter (HOSPITAL_COMMUNITY): Payer: Self-pay

## 2024-04-10 DIAGNOSIS — Z794 Long term (current) use of insulin: Secondary | ICD-10-CM

## 2024-04-10 DIAGNOSIS — I214 Non-ST elevation (NSTEMI) myocardial infarction: Principal | ICD-10-CM

## 2024-04-10 DIAGNOSIS — R0789 Other chest pain: Secondary | ICD-10-CM

## 2024-04-10 DIAGNOSIS — I10 Essential (primary) hypertension: Secondary | ICD-10-CM

## 2024-04-10 DIAGNOSIS — E1169 Type 2 diabetes mellitus with other specified complication: Secondary | ICD-10-CM

## 2024-04-10 DIAGNOSIS — I5021 Acute systolic (congestive) heart failure: Secondary | ICD-10-CM

## 2024-04-10 DIAGNOSIS — I513 Intracardiac thrombosis, not elsewhere classified: Secondary | ICD-10-CM

## 2024-04-10 DIAGNOSIS — E1165 Type 2 diabetes mellitus with hyperglycemia: Secondary | ICD-10-CM

## 2024-04-10 DIAGNOSIS — E872 Acidosis, unspecified: Secondary | ICD-10-CM

## 2024-04-10 DIAGNOSIS — R7989 Other specified abnormal findings of blood chemistry: Secondary | ICD-10-CM

## 2024-04-10 DIAGNOSIS — I509 Heart failure, unspecified: Principal | ICD-10-CM

## 2024-04-10 LAB — COMPREHENSIVE METABOLIC PANEL WITH GFR
ALT: 27 U/L (ref 0–44)
AST: 38 U/L (ref 15–41)
Albumin: 3.9 g/dL (ref 3.5–5.0)
Alkaline Phosphatase: 64 U/L (ref 38–126)
Anion gap: 13 (ref 5–15)
BUN: 20 mg/dL (ref 6–20)
CO2: 22 mmol/L (ref 22–32)
Calcium: 9.6 mg/dL (ref 8.9–10.3)
Chloride: 101 mmol/L (ref 98–111)
Creatinine, Ser: 0.91 mg/dL (ref 0.44–1.00)
GFR, Estimated: >60 mL/min
Glucose, Bld: 312 mg/dL — ABNORMAL HIGH (ref 70–99)
Potassium: 4.5 mmol/L (ref 3.5–5.1)
Sodium: 137 mmol/L (ref 135–145)
Total Bilirubin: 0.2 mg/dL (ref 0.0–1.2)
Total Protein: 6.9 g/dL (ref 6.5–8.1)

## 2024-04-10 LAB — TROPONIN T, HIGH SENSITIVITY
Troponin T High Sensitivity: 96 ng/L — ABNORMAL HIGH (ref 0–19)
Troponin T High Sensitivity: 96 ng/L — ABNORMAL HIGH (ref 0–19)

## 2024-04-10 LAB — I-STAT CG4 LACTIC ACID, ED
Lactic Acid, Venous: 2.5 mmol/L (ref 0.5–1.9)
Lactic Acid, Venous: 2.5 mmol/L (ref 0.5–1.9)

## 2024-04-10 LAB — CBC
HCT: 40.2 % (ref 36.0–46.0)
Hemoglobin: 13.6 g/dL (ref 12.0–15.0)
MCH: 27.9 pg (ref 26.0–34.0)
MCHC: 33.8 g/dL (ref 30.0–36.0)
MCV: 82.4 fL (ref 80.0–100.0)
Platelets: 173 10*3/uL (ref 150–400)
RBC: 4.88 MIL/uL (ref 3.87–5.11)
RDW: 13.8 % (ref 11.5–15.5)
WBC: 7.7 10*3/uL (ref 4.0–10.5)
nRBC: 0 % (ref 0.0–0.2)

## 2024-04-10 LAB — RESP PANEL BY RT-PCR (RSV, FLU A&B, COVID)  RVPGX2
Influenza A by PCR: NEGATIVE
Influenza B by PCR: NEGATIVE
Resp Syncytial Virus by PCR: NEGATIVE
SARS Coronavirus 2 by RT PCR: NEGATIVE

## 2024-04-10 LAB — D-DIMER, QUANTITATIVE: D-Dimer, Quant: 0.41 ug{FEU}/mL (ref 0.00–0.50)

## 2024-04-10 LAB — PRO BRAIN NATRIURETIC PEPTIDE: Pro Brain Natriuretic Peptide: 959 pg/mL — ABNORMAL HIGH

## 2024-04-10 LAB — CBG MONITORING, ED: Glucose-Capillary: 293 mg/dL — ABNORMAL HIGH (ref 70–99)

## 2024-04-10 LAB — LIPASE, BLOOD: Lipase: 51 U/L (ref 11–51)

## 2024-04-10 MED ORDER — FUROSEMIDE 10 MG/ML IJ SOLN
40.0000 mg | Freq: Once | INTRAMUSCULAR | Status: AC
  Administered 2024-04-10: 40 mg via INTRAVENOUS
  Filled 2024-04-10: qty 4

## 2024-04-10 MED ORDER — IPRATROPIUM-ALBUTEROL 0.5-2.5 (3) MG/3ML IN SOLN
3.0000 mL | Freq: Once | RESPIRATORY_TRACT | Status: AC
  Administered 2024-04-10: 3 mL via RESPIRATORY_TRACT
  Filled 2024-04-10: qty 3

## 2024-04-10 MED ORDER — IOHEXOL 350 MG/ML SOLN
75.0000 mL | Freq: Once | INTRAVENOUS | Status: AC | PRN
  Administered 2024-04-10: 75 mL via INTRAVENOUS

## 2024-04-10 NOTE — ED Notes (Signed)
 Patient ambulated to bathroom requiring very minimal assistance, steady gait, denies complaints. No O2 requirements needed for transport.

## 2024-04-10 NOTE — ED Triage Notes (Signed)
 Pt bibems from home. Complains of chest pressure 10/10 that radiates to right arm x 67month. Lungs clear with some SHOB. Pt given 0.4mg  nitroglycerin  x 2 SL and 324 mg aspirin  given PO. Per EMS pain improved with meds, pain now 8/10.  O2 90% RA - 96% on 4L [place]  Daughter reports abdominal distention, rectal bleeding, and constipation (unknown last BM).

## 2024-04-10 NOTE — ED Provider Notes (Signed)
 Patient admitted to Dr Sim hospitalist for new onset CHF IV diuresis ordered, BNP added on   Glendy Barsanti J, MD 04/10/24 2315

## 2024-04-10 NOTE — ED Notes (Signed)
 Patient transported to CT

## 2024-04-10 NOTE — ED Provider Notes (Signed)
 "  EMERGENCY DEPARTMENT AT [facility] Provider Note   CSN: [id number] Arrival date & time: 04/10/24  1815     Patient presents with: Chest Pain, Abdominal Pain, and Shortness of Breath   Tracy Sanders is a 59 y.o. female.  {Add pertinent medical, surgical, social history, OB history to HPI:6230} 59 year old female with past medical history of diabetes, hypertension, and hyperlipidemia presenting to the emergency department today with shortness of breath.  The patient is presenting today with chest tightness and shortness of breath started earlier today.  Family notes that her abdomen has been more swollen than normal and that she has been is complaining of some dyspnea on exertion over the past few days.  The patient was admitted earlier this month for the flu.  She was apparently very hypertensive with medics initially and was complaining of shortness of breath.  She did receive nitroglycerin  which did help with her blood pressure.  This also helped somewhat with her symptoms.  She was brought to the ER at that time for further evaluation.  The patient denies any fevers, vomiting, or diarrhea.   Chest Pain Associated symptoms: abdominal pain and shortness of breath   Abdominal Pain Associated symptoms: chest pain and shortness of breath   Shortness of Breath Associated symptoms: abdominal pain and chest pain        Prior to Admission medications  Medication Sig Start Date End Date Taking? Authorizing Provider  LANTUS  SOLOSTAR 100 UNIT/ML Solostar Pen INJECT 16 UNITS SUBCUTANEOUSLY ONCE DAILY FOR 3 DAYS THEN  18  UNITS  DAILY  FOR  3  DAYS  THEN  20  UNITS  DAILY  FOR  24  DAYS 02/11/24  Yes [provider]  atorvastatin  (LIPITOR ) 80 MG tablet Take 1 tablet (80 mg total) by mouth daily. 06/14/19   Melonie Colonel, Mikel HERO, MD  benzonatate  (TESSALON ) 100 MG capsule Take 1-2 capsules (100-200 mg total) by mouth 3 (three) times daily as needed for cough. 04/03/20    Just, Kelsea J, FNP  blood glucose meter kit and supplies Dispense based on patient and insurance preference. Use up to four times daily as directed. (FOR ICD-10 E10.9, E11.9). 09/17/17   Stallings, Zoe A, MD  canagliflozin  (INVOKANA ) 100 MG TABS tablet Take 1 tablet (100 mg total) by mouth daily before breakfast. 02/07/20   Just, Kelsea J, FNP  diclofenac  Sodium (VOLTAREN ) 1 % GEL Apply 2 g topically 4 (four) times daily. 02/06/20   Just, Kelsea J, FNP  Dulaglutide  (TRULICITY ) 0.75 MG/0.5ML SOPN Inject 0.75 mg into the skin once a week. 02/11/20   Just, Kelsea J, FNP  Elastic Bandages & Supports (MEDICAL COMPRESSION STOCKINGS) MISC Wear daily to relieve pain from varicose veins. P16.106 09/17/17   Bettie Banks A, MD  glucose blood (IGLUCOSE TEST STRIPS) test strip Use as instructed 09/17/17   Stallings, Zoe A, MD  Lancet Devices (LANCING DEVICE) MISC Use to check blood glucose once daily and as needed 09/17/17   Stallings, Zoe A, MD  Lancets MISC Use to check blood glucose once daily and as needed. E11.9 09/17/17   Stallings, Zoe A, MD  levothyroxine  (SYNTHROID ) 50 MCG tablet Take 50 mcg by mouth every morning.    [provider]  lisinopril  (ZESTRIL ) 10 MG tablet Take 1 tablet (10 mg total) by mouth daily. 04/03/20   Just, Kelsea J, FNP  lisinopril  (ZESTRIL ) 20 MG tablet Take 20 mg by mouth daily.    [provider]  meclizine  (ANTIVERT ) 12.5 MG tablet Take 1 tablet (12.5 mg total) by mouth 2 (two) times daily as needed for dizziness. 04/27/20   Kip Ade, NP  metFORMIN  (GLUCOPHAGE ) 500 MG tablet Take 1 tablet (500 mg total) by mouth 2 (two) times daily with a meal. 04/03/20   Just, Kelsea J, FNP  ondansetron  (ZOFRAN ) 4 MG tablet Take 1 tablet (4 mg total) by mouth every 8 (eight) hours as needed for nausea or vomiting. 02/10/20   Kip Ade, NP    Allergies: Other    Review of Systems  Respiratory:  Positive for shortness of breath.   Cardiovascular:  Positive for chest  pain.  Gastrointestinal:  Positive for abdominal pain.  All other systems reviewed and are negative.   Updated Vital Signs BP 130/81   Pulse 91   Resp (!) 21   LMP 09/14/2017   SpO2 95%   Physical Exam Vitals and nursing note reviewed.   Gen: NAD Eyes: PERRL, EOMI HEENT: no oropharyngeal swelling Neck: trachea midline Resp: wheezes throughout lower lung fields Card: RRR, no murmurs, rubs, or gallops Abd: moderate abdominal distention with no guarding or rebound Extremities: no calf tenderness, no edema Vascular: 2+ radial pulses bilaterally, 2+ DP pulses bilaterally Skin: no rashes Psyc: acting appropriately   (all labs ordered are listed, but only abnormal results are displayed) Labs Reviewed  I-STAT CG4 LACTIC ACID, ED - Abnormal; Notable for the following components:      Result Value   Lactic Acid, Venous 2.5 (*)    All other components within normal limits  CBG MONITORING, ED - Abnormal; Notable for the following components:   Glucose-Capillary 293 (*)    All other components within normal limits  I-STAT CG4 LACTIC ACID, ED - Abnormal; Notable for the following components:   Lactic Acid, Venous 2.5 (*)    All other components within normal limits  TROPONIN T, HIGH SENSITIVITY - Abnormal; Notable for the following components:   Troponin T High Sensitivity 96 (*)    All other components within normal limits  RESP PANEL BY RT-PCR (RSV, FLU A&B, COVID)  RVPGX2  CBC  D-DIMER, QUANTITATIVE  COMPREHENSIVE METABOLIC PANEL WITH GFR  LIPASE, BLOOD  TROPONIN T, HIGH SENSITIVITY    EKG: EKG Interpretation Date/Time:  Sunday April 10 2024 18:23:36 EST Ventricular Rate:  108 PR Interval:  145 QRS Duration:  95 QT Interval:  362 QTC Calculation: 486 R Axis:   95  Text Interpretation: Sinus tachycardia Right axis deviation Abnormal T, consider ischemia, diffuse leads Baseline wander in lead(s) V3 V6 Relatively similar in morphology to previous EKGs Confirmed by  Ula Barter [id number]) on 04/10/2024 6:29:06 PM  Radiology: [facility] Chest Port 1 View Result Date: 04/10/2024 CLINICAL DATA:  Initial evaluation for acute chest pain. EXAM: PORTABLE CHEST 1 VIEW COMPARISON:  Prior radiograph from 03/16/2024. FINDINGS: Cardiomegaly, stable.  Mediastinal silhouette within normal limits. Lungs are hypoinflated. Mild perihilar vascular congestion without overt pulmonary edema. No pleural effusion. No focal infiltrates. No pneumothorax. Visualized soft tissues and osseous structures demonstrate no acute finding. IMPRESSION: 1. Cardiomegaly with mild perihilar vascular congestion without overt pulmonary edema. 2. No other active cardiopulmonary disease. Electronically Signed   By: Morene Hoard M.D.   On: 04/10/2024 18:56    {Document cardiac monitor, telemetry assessment procedure when appropriate:32947} Procedures   Medications Ordered in the ED  ipratropium-albuterol  (DUONEB) 0.5-2.5 (3) MG/3ML nebulizer solution 3 mL (3 mLs Nebulization Given 04/10/24 1851)      {Click here for  ABCD2, HEART and other calculators REFRESH Note before signing:1}                              Medical Decision Making 59 year old female with past medical history of diabetes, hypertension, and hyperlipidemia presenting to the emergency department today with right-sided chest discomfort/chest pressure as well as some shortness of breath and abdominal distention.  I will further evaluate the patient here with basic labs Wels and EKG, chest x-ray, and troponin for further evaluation for ACS, pulmonary edema, pulmonary infiltrates, pneumothorax.  I will obtain a CT scan of patient's abdomen for further evaluation for obstruction, diverticulitis, colitis, appendicitis, or other intra-abdominal pathology.  I will give the patient a DuoNeb here for symptoms.  Will obtain a D-dimer to evaluate for pulmonary embolism as she is mildly tachycardic here on arrival.  This may be due to hypertensive  urgency/emergency as the patient was having worsening symptoms with her blood pressure was high and this improved with nitroglycerin .  Will monitor this here.  She will likely require admission.  The patient's initial troponin is elevated in the 90s.  Her EKG has some nonspecific changes.  Chest x-ray shows some cardiomegaly and vascular congestion.  She does not have a known history of CHF.  I think that she will require admission for new onset CHF.  I will reevaluate for ultimate disposition.  Amount and/or Complexity of Data Reviewed Labs: ordered. Radiology: ordered.  Risk Prescription drug management.   ***  {Document critical care time when appropriate  Document review of labs and clinical decision tools ie CHADS2VASC2, etc  Document your independent review of radiology images and any outside records  Document your discussion with family members, caretakers and with consultants  Document social determinants of health affecting pt's care  Document your decision making why or why not admission, treatments were needed:32947:::1}   Final diagnoses:  None    ED Discharge Orders     None        "

## 2024-04-10 NOTE — H&P (Incomplete)
 " History and Physical    Patient: Chalee Hirota FMW:[id number] DOB: 10-25-1965 DOA: 04/10/2024 DOS: the patient was seen and examined on 04/10/2024 PCP: Default, Provider, MD  Patient coming from: {Point_of_Origin:26777}  Chief Complaint:  Chief Complaint  Patient presents with   Chest Pain   Abdominal Pain   Shortness of Breath   HPI: Rhilyn Battle is a 59 y.o. female with medical history significant of ***  Review of Systems: {ROS_Text:26778} Past Medical History:  Diagnosis Date   Diabetes mellitus without complication (HCC)    Hypercholesteremia    Hypertension    Past Surgical History:  Procedure Laterality Date   APPENDECTOMY     Social History:  reports that she has been smoking. She has never used smokeless tobacco. She reports that she does not drink alcohol and does not use drugs.  Allergies[1]  Family History  Problem Relation Age of Onset   Stroke Mother    Hypertension Sister    Diabetes Sister    Hypertension Brother    Diabetes Brother     Prior to Admission medications  Medication Sig Start Date End Date Taking? Authorizing Provider  LANTUS  SOLOSTAR 100 UNIT/ML Solostar Pen INJECT 16 UNITS SUBCUTANEOUSLY ONCE DAILY FOR 3 DAYS THEN  18  UNITS  DAILY  FOR  3  DAYS  THEN  20  UNITS  DAILY  FOR  24  DAYS 02/11/24  Yes [provider]  atorvastatin  (LIPITOR ) 80 MG tablet Take 1 tablet (80 mg total) by mouth daily. 06/14/19   Melonie Colonel, Mikel HERO, MD  benzonatate  (TESSALON ) 100 MG capsule Take 1-2 capsules (100-200 mg total) by mouth 3 (three) times daily as needed for cough. 04/03/20   Just, Kelsea J, FNP  blood glucose meter kit and supplies Dispense based on patient and insurance preference. Use up to four times daily as directed. (FOR ICD-10 E10.9, E11.9). 09/17/17   Stallings, Zoe A, MD  canagliflozin  (INVOKANA ) 100 MG TABS tablet Take 1 tablet (100 mg total) by mouth daily before breakfast. 02/07/20   Just, Kelsea J, FNP  diclofenac  Sodium  (VOLTAREN ) 1 % GEL Apply 2 g topically 4 (four) times daily. 02/06/20   Just, Kelsea J, FNP  Dulaglutide  (TRULICITY ) 0.75 MG/0.5ML SOPN Inject 0.75 mg into the skin once a week. 02/11/20   Just, Kelsea J, FNP  Elastic Bandages & Supports (MEDICAL COMPRESSION STOCKINGS) MISC Wear daily to relieve pain from varicose veins. P16.106 09/17/17   Bettie Banks A, MD  glucose blood (IGLUCOSE TEST STRIPS) test strip Use as instructed 09/17/17   Stallings, Zoe A, MD  Lancet Devices (LANCING DEVICE) MISC Use to check blood glucose once daily and as needed 09/17/17   Stallings, Zoe A, MD  Lancets MISC Use to check blood glucose once daily and as needed. E11.9 09/17/17   Stallings, Zoe A, MD  levothyroxine  (SYNTHROID ) 50 MCG tablet Take 50 mcg by mouth every morning.    [provider]  lisinopril  (ZESTRIL ) 10 MG tablet Take 1 tablet (10 mg total) by mouth daily. 04/03/20   Just, Kelsea J, FNP  lisinopril  (ZESTRIL ) 20 MG tablet Take 20 mg by mouth daily.    [provider]  meclizine  (ANTIVERT ) 12.5 MG tablet Take 1 tablet (12.5 mg total) by mouth 2 (two) times daily as needed for dizziness. 04/27/20   Kip Ade, NP  metFORMIN  (GLUCOPHAGE ) 500 MG tablet Take 1 tablet (500 mg total) by mouth 2 (two) times daily with a meal. 04/03/20   Just, Kelsea  J, FNP  ondansetron  (ZOFRAN ) 4 MG tablet Take 1 tablet (4 mg total) by mouth every 8 (eight) hours as needed for nausea or vomiting. 02/10/20   Kip Ade, NP    Physical Exam: Vitals:   04/10/24 1900 04/10/24 1921 04/10/24 2100 04/10/24 2151  BP: 130/81  130/84   Pulse: 91  (!) 102   Resp: (!) 21  (!) 21   Temp:    98.2 F (36.8 C)  TempSrc:    Oral  SpO2: 100% 95% 97%    *** Data Reviewed: {Tip this will not be part of the note when signed- Document your independent interpretation of telemetry tracing, EKG, lab, Radiology test or any other diagnostic tests. Add any new diagnostic test ordered  today. (Optional):26781} {Results:26384}  Assessment and Plan: No notes have been filed under this hospital service. Service: Hospitalist     Advance Care Planning:   Code Status: Not on file ***  Consults: ***  Family Communication: ***  Severity of Illness: {Observation/Inpatient:21159}  Author: Posey Maier, DO 04/10/2024 11:12 PM  For on call review [email or web address].     [1]  Allergies Allergen Reactions   Other     a cold medicine  - not sure which one   "

## 2024-04-11 ENCOUNTER — Telehealth (HOSPITAL_COMMUNITY): Payer: Self-pay

## 2024-04-11 ENCOUNTER — Inpatient Hospital Stay (HOSPITAL_COMMUNITY): Payer: Self-pay

## 2024-04-11 ENCOUNTER — Other Ambulatory Visit (HOSPITAL_COMMUNITY): Payer: Self-pay

## 2024-04-11 ENCOUNTER — Inpatient Hospital Stay (HOSPITAL_COMMUNITY)

## 2024-04-11 ENCOUNTER — Other Ambulatory Visit: Payer: Self-pay

## 2024-04-11 DIAGNOSIS — I5021 Acute systolic (congestive) heart failure: Secondary | ICD-10-CM

## 2024-04-11 DIAGNOSIS — I214 Non-ST elevation (NSTEMI) myocardial infarction: Secondary | ICD-10-CM

## 2024-04-11 DIAGNOSIS — I509 Heart failure, unspecified: Secondary | ICD-10-CM

## 2024-04-11 LAB — ECHOCARDIOGRAM COMPLETE
Area-P 1/2: 6.54 cm2
Calc EF: 32.9 %
Height: 58 in
S' Lateral: 4.05 cm
Single Plane A2C EF: 27.1 %
Single Plane A4C EF: 38.7 %
Weight: 2557.34 [oz_av]

## 2024-04-11 LAB — CBC
HCT: 43.3 % (ref 36.0–46.0)
HCT: 43.4 % (ref 36.0–46.0)
Hemoglobin: 14.5 g/dL (ref 12.0–15.0)
Hemoglobin: 14.8 g/dL (ref 12.0–15.0)
MCH: 28.1 pg (ref 26.0–34.0)
MCH: 28.1 pg (ref 26.0–34.0)
MCHC: 33.5 g/dL (ref 30.0–36.0)
MCHC: 34.1 g/dL (ref 30.0–36.0)
MCV: 83.9 fL (ref 80.0–100.0)
MCV: 82.4 fL (ref 80.0–100.0)
Platelets: 177 10*3/uL (ref 150–400)
Platelets: 192 10*3/uL (ref 150–400)
RBC: 5.16 MIL/uL — ABNORMAL HIGH (ref 3.87–5.11)
RBC: 5.27 MIL/uL — ABNORMAL HIGH (ref 3.87–5.11)
RDW: 14 % (ref 11.5–15.5)
RDW: 14 % (ref 11.5–15.5)
WBC: 8.2 10*3/uL (ref 4.0–10.5)
WBC: 8.1 10*3/uL (ref 4.0–10.5)
nRBC: 0 % (ref 0.0–0.2)
nRBC: 0 % (ref 0.0–0.2)

## 2024-04-11 LAB — HIV ANTIBODY (ROUTINE TESTING W REFLEX): HIV Screen 4th Generation wRfx: NONREACTIVE

## 2024-04-11 LAB — COMPREHENSIVE METABOLIC PANEL WITH GFR
ALT: 28 U/L (ref 0–44)
AST: 33 U/L (ref 15–41)
Albumin: 4.4 g/dL (ref 3.5–5.0)
Alkaline Phosphatase: 68 U/L (ref 38–126)
Anion gap: 15 (ref 5–15)
BUN: 14 mg/dL (ref 6–20)
CO2: 24 mmol/L (ref 22–32)
Calcium: 9.6 mg/dL (ref 8.9–10.3)
Chloride: 100 mmol/L (ref 98–111)
Creatinine, Ser: 0.63 mg/dL (ref 0.44–1.00)
GFR, Estimated: >60 mL/min
Glucose, Bld: 265 mg/dL — ABNORMAL HIGH (ref 70–99)
Potassium: 3.9 mmol/L (ref 3.5–5.1)
Sodium: 138 mmol/L (ref 135–145)
Total Bilirubin: 0.4 mg/dL (ref 0.0–1.2)
Total Protein: 7.6 g/dL (ref 6.5–8.1)

## 2024-04-11 LAB — TSH: TSH: 4.58 u[IU]/mL — ABNORMAL HIGH (ref 0.350–4.500)

## 2024-04-11 LAB — HEMOGLOBIN A1C
Hgb A1c MFr Bld: 8.2 % — ABNORMAL HIGH (ref 4.8–5.6)
Mean Plasma Glucose: 188.64 mg/dL

## 2024-04-11 LAB — CBG MONITORING, ED
Glucose-Capillary: 290 mg/dL — ABNORMAL HIGH (ref 70–99)
Glucose-Capillary: 182 mg/dL — ABNORMAL HIGH (ref 70–99)

## 2024-04-11 LAB — TROPONIN T, HIGH SENSITIVITY
Troponin T High Sensitivity: 131 ng/L (ref 0–19)
Troponin T High Sensitivity: 172 ng/L (ref 0–19)

## 2024-04-11 LAB — CREATININE, SERUM
Creatinine, Ser: 0.61 mg/dL (ref 0.44–1.00)
GFR, Estimated: >60 mL/min

## 2024-04-11 LAB — GLUCOSE, CAPILLARY
Glucose-Capillary: 232 mg/dL — ABNORMAL HIGH (ref 70–99)
Glucose-Capillary: 148 mg/dL — ABNORMAL HIGH (ref 70–99)
Glucose-Capillary: 233 mg/dL — ABNORMAL HIGH (ref 70–99)

## 2024-04-11 LAB — I-STAT CG4 LACTIC ACID, ED
Lactic Acid, Venous: 2.6 mmol/L (ref 0.5–1.9)
Lactic Acid, Venous: 1.9 mmol/L (ref 0.5–1.9)

## 2024-04-11 LAB — MAGNESIUM: Magnesium: 1.9 mg/dL (ref 1.7–2.4)

## 2024-04-11 LAB — HEPARIN LEVEL (UNFRACTIONATED): Heparin Unfractionated: 0.14 [IU]/mL — ABNORMAL LOW (ref 0.30–0.70)

## 2024-04-11 LAB — PHOSPHORUS: Phosphorus: 4.2 mg/dL (ref 2.5–4.6)

## 2024-04-11 MED ORDER — HEPARIN BOLUS VIA INFUSION
2800.0000 [IU] | Freq: Once | INTRAVENOUS | Status: AC
  Administered 2024-04-11: 2800 [IU] via INTRAVENOUS
  Filled 2024-04-11: qty 2800

## 2024-04-11 MED ORDER — ACETAMINOPHEN 650 MG RE SUPP: 650.0000 mg | Freq: Four times a day (QID) | RECTAL | Status: DC | PRN

## 2024-04-11 MED ORDER — MAGNESIUM SULFATE 2 GM/50ML IV SOLN
2.0000 g | Freq: Once | INTRAVENOUS | Status: AC
  Administered 2024-04-11: 2 g via INTRAVENOUS
  Filled 2024-04-11: qty 50

## 2024-04-11 MED ORDER — NITROGLYCERIN 2 % TD OINT
1.0000 [in_us] | TOPICAL_OINTMENT | Freq: Four times a day (QID) | TRANSDERMAL | Status: DC
  Administered 2024-04-11 – 2024-04-12 (×3): 1 [in_us] via TOPICAL
  Filled 2024-04-11 (×3): qty 1

## 2024-04-11 MED ORDER — EMPAGLIFLOZIN 10 MG PO TABS
10.0000 mg | ORAL_TABLET | Freq: Every day | ORAL | Status: DC
  Administered 2024-04-11 – 2024-04-14 (×4): 10 mg via ORAL
  Filled 2024-04-11 (×4): qty 1

## 2024-04-11 MED ORDER — LEVOTHYROXINE SODIUM 50 MCG PO TABS
50.0000 ug | ORAL_TABLET | Freq: Every morning | ORAL | Status: DC
  Administered 2024-04-11 – 2024-04-14 (×4): 50 ug via ORAL
  Filled 2024-04-11: qty 1
  Filled 2024-04-11: qty 2
  Filled 2024-04-11 (×3): qty 1

## 2024-04-11 MED ORDER — INSULIN GLARGINE-YFGN 100 UNIT/ML ~~LOC~~ SOLN
10.0000 [IU] | Freq: Every day | SUBCUTANEOUS | Status: DC
  Administered 2024-04-11: 10 [IU] via SUBCUTANEOUS
  Filled 2024-04-11 (×2): qty 0.1

## 2024-04-11 MED ORDER — ACETAMINOPHEN 325 MG PO TABS
650.0000 mg | ORAL_TABLET | Freq: Four times a day (QID) | ORAL | Status: DC | PRN
  Administered 2024-04-12 – 2024-04-13 (×2): 650 mg via ORAL
  Filled 2024-04-11 (×2): qty 2

## 2024-04-11 MED ORDER — FREE WATER
250.0000 mL | Freq: Once | Status: AC
  Administered 2024-04-12: 250 mL via ORAL

## 2024-04-11 MED ORDER — NITROGLYCERIN 0.4 MG SL SUBL: 0.4000 mg | SUBLINGUAL_TABLET | SUBLINGUAL | Status: DC | PRN

## 2024-04-11 MED ORDER — IOHEXOL 350 MG/ML SOLN
75.0000 mL | Freq: Once | INTRAVENOUS | Status: AC | PRN
  Administered 2024-04-11: 75 mL via INTRAVENOUS

## 2024-04-11 MED ORDER — INSULIN GLARGINE-YFGN 100 UNIT/ML ~~LOC~~ SOLN
20.0000 [IU] | Freq: Every day | SUBCUTANEOUS | Status: DC
  Administered 2024-04-11 – 2024-04-13 (×3): 20 [IU] via SUBCUTANEOUS
  Filled 2024-04-11 (×6): qty 0.2

## 2024-04-11 MED ORDER — ASPIRIN 81 MG PO TBEC
81.0000 mg | DELAYED_RELEASE_TABLET | Freq: Every day | ORAL | Status: DC
  Administered 2024-04-11 – 2024-04-14 (×3): 81 mg via ORAL
  Filled 2024-04-11 (×4): qty 1

## 2024-04-11 MED ORDER — FUROSEMIDE 10 MG/ML IJ SOLN
40.0000 mg | Freq: Two times a day (BID) | INTRAMUSCULAR | Status: DC
  Administered 2024-04-11 – 2024-04-12 (×3): 40 mg via INTRAVENOUS
  Filled 2024-04-11 (×3): qty 4

## 2024-04-11 MED ORDER — INSULIN ASPART 100 UNIT/ML IJ SOLN
0.0000 [IU] | Freq: Three times a day (TID) | INTRAMUSCULAR | Status: DC
  Administered 2024-04-11 (×2): 3 [IU] via SUBCUTANEOUS
  Administered 2024-04-11: 5 [IU] via SUBCUTANEOUS
  Administered 2024-04-12: 3 [IU] via SUBCUTANEOUS
  Administered 2024-04-12 – 2024-04-13 (×2): 5 [IU] via SUBCUTANEOUS
  Administered 2024-04-13: 3 [IU] via SUBCUTANEOUS
  Administered 2024-04-13: 11 [IU] via SUBCUTANEOUS
  Administered 2024-04-14 (×2): 3 [IU] via SUBCUTANEOUS
  Filled 2024-04-11: qty 3
  Filled 2024-04-11: qty 5
  Filled 2024-04-11: qty 3
  Filled 2024-04-11: qty 5
  Filled 2024-04-11 (×2): qty 3
  Filled 2024-04-11: qty 11
  Filled 2024-04-11 (×2): qty 3
  Filled 2024-04-11: qty 5

## 2024-04-11 MED ORDER — PERFLUTREN LIPID MICROSPHERE
1.0000 mL | INTRAVENOUS | Status: AC | PRN
  Administered 2024-04-11: 2 mL via INTRAVENOUS

## 2024-04-11 MED ORDER — LISINOPRIL 10 MG PO TABS: 10.0000 mg | ORAL_TABLET | Freq: Every day | ORAL | Status: DC

## 2024-04-11 MED ORDER — ATORVASTATIN CALCIUM 80 MG PO TABS
80.0000 mg | ORAL_TABLET | Freq: Every day | ORAL | Status: DC
  Administered 2024-04-11 – 2024-04-14 (×4): 80 mg via ORAL
  Filled 2024-04-11 (×4): qty 1

## 2024-04-11 MED ORDER — LOSARTAN POTASSIUM 25 MG PO TABS
25.0000 mg | ORAL_TABLET | Freq: Every day | ORAL | Status: DC
  Administered 2024-04-11 – 2024-04-14 (×4): 25 mg via ORAL
  Filled 2024-04-11 (×4): qty 1

## 2024-04-11 MED ORDER — ONDANSETRON HCL 4 MG/2ML IJ SOLN: 4.0000 mg | Freq: Four times a day (QID) | INTRAMUSCULAR | Status: DC | PRN

## 2024-04-11 MED ORDER — INSULIN ASPART 100 UNIT/ML IJ SOLN
0.0000 [IU] | Freq: Every day | INTRAMUSCULAR | Status: DC
  Administered 2024-04-11: 2 [IU] via SUBCUTANEOUS
  Administered 2024-04-11: 3 [IU] via SUBCUTANEOUS
  Filled 2024-04-11: qty 2
  Filled 2024-04-11: qty 5
  Filled 2024-04-11: qty 3

## 2024-04-11 MED ORDER — HEPARIN BOLUS VIA INFUSION
3000.0000 [IU] | Freq: Once | INTRAVENOUS | Status: AC
  Administered 2024-04-11: 3000 [IU] via INTRAVENOUS
  Filled 2024-04-11: qty 3000

## 2024-04-11 MED ORDER — ASPIRIN 81 MG PO CHEW
81.0000 mg | CHEWABLE_TABLET | ORAL | Status: AC
  Administered 2024-04-12: 81 mg via ORAL
  Filled 2024-04-11: qty 1

## 2024-04-11 MED ORDER — POTASSIUM CHLORIDE CRYS ER 20 MEQ PO TBCR
40.0000 meq | EXTENDED_RELEASE_TABLET | Freq: Once | ORAL | Status: AC
  Administered 2024-04-11: 40 meq via ORAL
  Filled 2024-04-11: qty 2

## 2024-04-11 MED ORDER — ONDANSETRON HCL 4 MG PO TABS: 4.0000 mg | ORAL_TABLET | Freq: Four times a day (QID) | ORAL | Status: DC | PRN

## 2024-04-11 MED ORDER — ENOXAPARIN SODIUM 40 MG/0.4ML IJ SOSY: 40.0000 mg | PREFILLED_SYRINGE | INTRAMUSCULAR | Status: DC

## 2024-04-11 MED ORDER — HEPARIN (PORCINE) 25000 UT/250ML-% IV SOLN
1000.0000 [IU]/h | INTRAVENOUS | Status: DC
  Administered 2024-04-11: 800 [IU]/h via INTRAVENOUS
  Administered 2024-04-12: 1000 [IU]/h via INTRAVENOUS
  Filled 2024-04-11 (×2): qty 250

## 2024-04-11 NOTE — Plan of Care (Signed)
   Problem: Coping: Goal: Ability to adjust to condition or change in health will improve Outcome: Progressing

## 2024-04-11 NOTE — Progress Notes (Signed)
 Per charge nurse, family has financial concerns. CSW followed up with patients daughter, Curlene. Avdiha informed CSW that family has insurance coverage concerns and want to know what insurance patient can obtain. CSW informed Avdiha that CSW is not equipped to advise on insurance coverage needs but can consult Financial Counseling in regards to a Medicaid screening for patient. Avdiha stated that would be okay at this time. CSW reached on to [place] with financial counseling.   Tiffini Blacksher, MSW, LCSWA Transitions of Care [phone]

## 2024-04-11 NOTE — ED Notes (Signed)
 Patient transported to MRI

## 2024-04-11 NOTE — Significant Event (Addendum)
 Patient's CT head was showing -   IMPRESSION: 1. Concern for a hyperdense M2 left MCA branch in the sylvian fissure. CTA head is recommended to exclude large vessel occlusion. No other hyperdense vessel is suspected. 2. No other evidence of acute intracranial CT findings. 3. Bilateral mastoid bone underpneumatization. 4. PRA is attempting to reach the ordering physician at the time of signing.  Discussed with on-call neurologist Dr. Vanessa.  Neurologist requested getting stat CT angiogram head and neck and MRI brain.  Patient's troponin also showing increasing trend but since there is concern for stroke cardiologist wants first to rule out stroke before starting heparin .  Redia Said MD.

## 2024-04-11 NOTE — ED Notes (Signed)
 Spoke with radiologist Dr. Beatriz regarding head ct results, Dr. Franky notified of results and provided with radiologist contact number for further questions if needed.

## 2024-04-11 NOTE — Progress Notes (Signed)
 PHARMACY - ANTICOAGULATION CONSULT NOTE  Pharmacy Consult for heparin   Indication: LV thrombus   Allergies[1]  Patient Measurements: Height: 4' 10 (147.3 cm) Weight: 72.5 kg (159 lb 13.3 oz) IBW/kg (Calculated) : 40.9 HEPARIN  DW (KG): 57.5  Vital Signs: Temp: 98 F (36.7 C) (02/02 1933) Temp Source: Oral (02/02 1933) BP: 109/74 (02/02 1933) Pulse Rate: 115 (02/02 1933)  Labs: Recent Labs    04/10/24 1827 04/11/24 0132 04/11/24 1012 04/11/24 1844  HGB 13.6 14.5 14.8  --   HCT 40.2 43.3 43.4  --   PLT 173 177 192  --   HEPARINUNFRC  --   --   --  0.14*  CREATININE 0.91 0.61 0.63  --     Estimated Creatinine Clearance: 63.2 mL/min (by C-G formula based on SCr of 0.63 mg/dL).   Medical History: Past Medical History:  Diagnosis Date   Diabetes mellitus without complication (HCC)    Hypercholesteremia    Hypertension     Medications:  Scheduled:   [START ON 04/12/2024] aspirin   81 mg Oral Pre-Cath   aspirin  EC  81 mg Oral Daily   atorvastatin   80 mg Oral Daily   empagliflozin   10 mg Oral Daily   [START ON 04/12/2024] free water   250 mL Oral Once   furosemide   40 mg Intravenous Q12H   insulin  aspart  0-15 Units Subcutaneous TID WC   insulin  aspart  0-5 Units Subcutaneous QHS   insulin  glargine-yfgn  20 Units Subcutaneous QHS   levothyroxine   50 mcg Oral q morning   losartan   25 mg Oral Daily   nitroGLYCERIN   1 inch Topical Q6H    Assessment: 58 yof presenting with chest pain and SOB. ECHO finding swirling in apex with likely small thrombus noted in setting of reduced EF. No AC PTA.   Hgb 14.8, plt 192. No s/sx of bleeding. Trop 96 up to 172 now.   Heparin  level is sub-therapeutic this evening at 0.14. Per RN, no issues with infusion or bleeding.  Goal of Therapy:  Heparin  level 0.3-0.7 units/ml Monitor platelets by anticoagulation protocol: Yes   Plan:  Give 3000 units bolus x 1 Increase heparin  infusion to 1000 units/hr Check anti-Xa level in 6-8  hours and daily while on heparin  Continue to monitor H&H and platelets   Thank you for allowing pharmacy to be a part of this patients care.   [place] Espina C Arthi Mcdonald, PharmD 04/11/2024 7:50 PM  **Pharmacist phone directory can be found on [email or web address] listed under [facility]**     [1]  Allergies Allergen Reactions   Morphine  Other (See Comments)    Passes out   Other     a cold medicine  - not sure which one

## 2024-04-12 ENCOUNTER — Encounter (HOSPITAL_COMMUNITY): Admission: EM | Disposition: A | Payer: Self-pay | Source: Home / Self Care | Attending: Internal Medicine

## 2024-04-12 ENCOUNTER — Encounter (HOSPITAL_COMMUNITY): Payer: Self-pay | Admitting: Cardiovascular Disease

## 2024-04-12 LAB — CBC
HCT: 44.6 % (ref 36.0–46.0)
Hemoglobin: 14.8 g/dL (ref 12.0–15.0)
MCH: 27.5 pg (ref 26.0–34.0)
MCHC: 33.2 g/dL (ref 30.0–36.0)
MCV: 82.7 fL (ref 80.0–100.0)
Platelets: 191 10*3/uL (ref 150–400)
RBC: 5.39 MIL/uL — ABNORMAL HIGH (ref 3.87–5.11)
RDW: 14.2 % (ref 11.5–15.5)
WBC: 8.6 10*3/uL (ref 4.0–10.5)
nRBC: 0 % (ref 0.0–0.2)

## 2024-04-12 LAB — LIPID PANEL
Cholesterol: 307 mg/dL — ABNORMAL HIGH (ref 0–200)
HDL: 47 mg/dL
LDL Cholesterol: 200 mg/dL — ABNORMAL HIGH (ref 0–99)
Total CHOL/HDL Ratio: 6.6 ratio
Triglycerides: 300 mg/dL — ABNORMAL HIGH
VLDL: 60 mg/dL — ABNORMAL HIGH (ref 0–40)

## 2024-04-12 LAB — COMPREHENSIVE METABOLIC PANEL WITH GFR
ALT: 34 U/L (ref 0–44)
AST: 33 U/L (ref 15–41)
Albumin: 4.4 g/dL (ref 3.5–5.0)
Alkaline Phosphatase: 73 U/L (ref 38–126)
Anion gap: 13 (ref 5–15)
BUN: 22 mg/dL — ABNORMAL HIGH (ref 6–20)
CO2: 27 mmol/L (ref 22–32)
Calcium: 10.3 mg/dL (ref 8.9–10.3)
Chloride: 97 mmol/L — ABNORMAL LOW (ref 98–111)
Creatinine, Ser: 0.68 mg/dL (ref 0.44–1.00)
GFR, Estimated: >60 mL/min
Glucose, Bld: 207 mg/dL — ABNORMAL HIGH (ref 70–99)
Potassium: 4.2 mmol/L (ref 3.5–5.1)
Sodium: 137 mmol/L (ref 135–145)
Total Bilirubin: 0.6 mg/dL (ref 0.0–1.2)
Total Protein: 7.7 g/dL (ref 6.5–8.1)

## 2024-04-12 LAB — GLUCOSE, CAPILLARY
Glucose-Capillary: 228 mg/dL — ABNORMAL HIGH (ref 70–99)
Glucose-Capillary: 204 mg/dL — ABNORMAL HIGH (ref 70–99)
Glucose-Capillary: 163 mg/dL — ABNORMAL HIGH (ref 70–99)
Glucose-Capillary: 176 mg/dL — ABNORMAL HIGH (ref 70–99)
Glucose-Capillary: 161 mg/dL — ABNORMAL HIGH (ref 70–99)

## 2024-04-12 LAB — POCT ACTIVATED CLOTTING TIME: Activated Clotting Time: 276 s

## 2024-04-12 LAB — HEPARIN LEVEL (UNFRACTIONATED): Heparin Unfractionated: 0.42 [IU]/mL (ref 0.30–0.70)

## 2024-04-12 MED ORDER — ASPIRIN 81 MG PO CHEW
CHEWABLE_TABLET | ORAL | Status: AC
  Filled 2024-04-12: qty 1

## 2024-04-12 MED ORDER — HEPARIN (PORCINE) IN NACL 1000-0.9 UT/500ML-% IV SOLN
INTRAVENOUS | Status: DC | PRN
  Administered 2024-04-12 (×2): 500 mL

## 2024-04-12 MED ORDER — SODIUM CHLORIDE 0.9 % IV SOLN: 250.0000 mL | INTRAVENOUS | Status: AC | PRN

## 2024-04-12 MED ORDER — SODIUM CHLORIDE 0.9 % IV SOLN: INTRAVENOUS | Status: AC

## 2024-04-12 MED ORDER — SODIUM CHLORIDE 0.9% FLUSH: 3.0000 mL | INTRAVENOUS | Status: DC | PRN

## 2024-04-12 MED ORDER — ASPIRIN 81 MG PO CHEW
CHEWABLE_TABLET | ORAL | Status: DC | PRN
  Administered 2024-04-12: 81 mg via ORAL

## 2024-04-12 MED ORDER — TICAGRELOR 90 MG PO TABS
ORAL_TABLET | ORAL | Status: AC
  Filled 2024-04-12: qty 2

## 2024-04-12 MED ORDER — MIDAZOLAM HCL (PF) 2 MG/2ML IJ SOLN
INTRAMUSCULAR | Status: DC | PRN
  Administered 2024-04-12: 1 mg via INTRAVENOUS

## 2024-04-12 MED ORDER — SODIUM CHLORIDE 0.9 % IV SOLN
INTRAVENOUS | Status: DC | PRN
  Administered 2024-04-12: 4 ug/kg/min via INTRAVENOUS

## 2024-04-12 MED ORDER — INSULIN ASPART 100 UNIT/ML IJ SOLN
3.0000 [IU] | Freq: Three times a day (TID) | INTRAMUSCULAR | Status: DC
  Administered 2024-04-12 – 2024-04-14 (×4): 3 [IU] via SUBCUTANEOUS
  Filled 2024-04-12 (×4): qty 3

## 2024-04-12 MED ORDER — SODIUM CHLORIDE 0.9 % IV SOLN
4.0000 ug/kg/min | INTRAVENOUS | Status: AC
  Filled 2024-04-12: qty 50

## 2024-04-12 MED ORDER — CANGRELOR TETRASODIUM 50 MG IV SOLR
INTRAVENOUS | Status: AC
  Filled 2024-04-12: qty 50

## 2024-04-12 MED ORDER — CANGRELOR BOLUS VIA INFUSION
INTRAVENOUS | Status: DC | PRN
  Administered 2024-04-12: 2172 ug via INTRAVENOUS

## 2024-04-12 MED ORDER — SODIUM CHLORIDE 0.9 % IV SOLN
INTRAVENOUS | Status: DC | PRN
  Administered 2024-04-12: 500 mL via INTRAVENOUS
  Administered 2024-04-12: 10 mL/h via INTRAVENOUS

## 2024-04-12 MED ORDER — FENTANYL CITRATE (PF) 100 MCG/2ML IJ SOLN
INTRAMUSCULAR | Status: AC
  Filled 2024-04-12: qty 2

## 2024-04-12 MED ORDER — IOHEXOL 350 MG/ML SOLN
INTRAVENOUS | Status: DC | PRN
  Administered 2024-04-12: 90 mL

## 2024-04-12 MED ORDER — LIDOCAINE HCL (PF) 1 % IJ SOLN
INTRAMUSCULAR | Status: AC
  Filled 2024-04-12: qty 30

## 2024-04-12 MED ORDER — EZETIMIBE 10 MG PO TABS
10.0000 mg | ORAL_TABLET | Freq: Every day | ORAL | Status: DC
  Administered 2024-04-13 – 2024-04-14 (×2): 10 mg via ORAL
  Filled 2024-04-12 (×2): qty 1

## 2024-04-12 MED ORDER — TICAGRELOR 90 MG PO TABS
90.0000 mg | ORAL_TABLET | Freq: Two times a day (BID) | ORAL | Status: DC
  Administered 2024-04-12 – 2024-04-13 (×2): 90 mg via ORAL
  Filled 2024-04-12 (×2): qty 1

## 2024-04-12 MED ORDER — MIDAZOLAM HCL 2 MG/2ML IJ SOLN
INTRAMUSCULAR | Status: AC
  Filled 2024-04-12: qty 2

## 2024-04-12 MED ORDER — TICAGRELOR 90 MG PO TABS
ORAL_TABLET | ORAL | Status: DC | PRN
  Administered 2024-04-12: 180 mg via ORAL

## 2024-04-12 MED ORDER — STERILE WATER FOR INJECTION IJ SOLN
INTRAMUSCULAR | Status: AC
  Filled 2024-04-12: qty 10

## 2024-04-12 MED ORDER — HEPARIN SODIUM (PORCINE) 1000 UNIT/ML IJ SOLN
INTRAMUSCULAR | Status: DC | PRN
  Administered 2024-04-12 (×2): 4000 [IU] via INTRAVENOUS

## 2024-04-12 MED ORDER — VERAPAMIL HCL 2.5 MG/ML IV SOLN
INTRAVENOUS | Status: AC
  Filled 2024-04-12: qty 2

## 2024-04-12 MED ORDER — HYDRALAZINE HCL 20 MG/ML IJ SOLN: 10.0000 mg | INTRAMUSCULAR | Status: AC | PRN

## 2024-04-12 MED ORDER — FENTANYL CITRATE (PF) 100 MCG/2ML IJ SOLN
INTRAMUSCULAR | Status: DC | PRN
  Administered 2024-04-12: 25 ug via INTRAVENOUS

## 2024-04-12 MED ORDER — LABETALOL HCL 5 MG/ML IV SOLN: 10.0000 mg | INTRAVENOUS | Status: AC | PRN

## 2024-04-12 MED ORDER — SODIUM CHLORIDE 0.9% FLUSH
3.0000 mL | Freq: Two times a day (BID) | INTRAVENOUS | Status: DC
  Administered 2024-04-12 – 2024-04-14 (×4): 3 mL via INTRAVENOUS

## 2024-04-12 MED ORDER — HEPARIN SODIUM (PORCINE) 1000 UNIT/ML IJ SOLN
INTRAMUSCULAR | Status: AC
  Filled 2024-04-12: qty 10

## 2024-04-12 MED ORDER — LIDOCAINE HCL (PF) 1 % IJ SOLN
INTRAMUSCULAR | Status: DC | PRN
  Administered 2024-04-12 (×2): 3 mL

## 2024-04-12 NOTE — Progress Notes (Signed)
 Heart Failure Nurse Navigator Progress Note  PCP: Default, Provider, MD PCP-Cardiologist: Floretta Admission Diagnosis: none Admitted from: Home via EMS  Presentation:   Tracy Sanders presented with complaints of chest pressure that radiates to her right arm for 1 month. Shortness of breath, daughter reported her mother has abdominal swelling, rectal bleeding and constipation. Chest x-ray showed cardiomegaly with mild perihilar vascular congestion without overt pulmonary edema. CT abdomen and pelvis with contrast showed no acute findings in the abdomen or pelvis.CTA head and neck with intracranial atherosclerosis, carotid artery disease. MRI brain negative for acute findings ,Pro BNP 959, Troponin 96, Lactic acid 2.5, CBG 312, BP 130/84, HR 102,  Left heart cath with revealed a severe stenosis of the RCA which was successfully stented. - proximal LAD disease as well along with other nonobstructive CAD will be medically managed.  Patients daughter Terren Haberle was called on the phone and educated about the sign and symptoms of heart failure, daily weights , when her mom should call her doctor or go to the ED. Diet and fluid restrictions. Daughter who translates for her mother stated that she does use a lot of salt daily, continued education on taking all her medications. Daughter stated that her mom has a Phobia about running out of her medications , mainly due to costs, that she will take her meds one day and then skip them for a day or two days,  then take them, again. Navigator stressed the importance and the why of taking all medications as prescribed. As well as attending al medical appointments. Daughter verbalized her understanding of education and she will be the one bringing her mom the the HF [facility] appointment on 04/22/2024 @ 9 am.   ECHO/ LVEF: 30-35% New   Clinical Course:  Past Medical History:  Diagnosis Date   Diabetes mellitus without complication (HCC)    Hypercholesteremia     Hypertension      Social History   Socioeconomic History   Marital status: Married    Spouse name: Not on file   Number of children: Not on file   Years of education: Not on file   Highest education level: Not on file  Occupational History   Not on file  Tobacco Use   Smoking status: Every Day    Current packs/day: 0.50    Types: Cigarettes   Smokeless tobacco: Never  Vaping Use   Vaping status: Never Used  Substance and Sexual Activity   Alcohol use: No   Drug use: No   Sexual activity: Yes  Other Topics Concern   Not on file  Social History Narrative   Not on file   Social Drivers of Health   Tobacco Use: High Risk (04/10/2024)   Patient History    Smoking Tobacco Use: Every Day    Smokeless Tobacco Use: Never    Passive Exposure: Not on file  Financial Resource Strain: Not on file  Food Insecurity: No Food Insecurity (04/11/2024)   Epic    Worried About Programme Researcher, Broadcasting/film/video in the Last Year: Never true    Ran Out of Food in the Last Year: Never true  Transportation Needs: No Transportation Needs (04/11/2024)   Epic    Lack of Transportation (Medical): No    Lack of Transportation (Non-Medical): No  Physical Activity: Not on file  Stress: Not on file  Social Connections: Unknown (07/23/2021)   Received from [facility]   Social Network    Social Network: Not on file  Depression (PHQ2-9): Not on file  Alcohol Screen: Not on file  Housing: Unknown (04/11/2024)   Epic    Unable to Pay for Housing in the Last Year: No    Number of Times Moved in the Last Year: Not on file    Homeless in the Last Year: No  Utilities: Not At Risk (04/11/2024)   Epic    Threatened with loss of utilities: No  Health Literacy: Not on file   Education Assessment and Provision:  Detailed education and instructions provided on heart failure disease management including the following:  Signs and symptoms of Heart Failure When to call the physician Importance of daily weights Low  sodium diet Fluid restriction Medication management Anticipated future follow-up appointments  Patient education given on each of the above topics.  Patient acknowledges understanding via teach back method and acceptance of all instructions.  Education Materials:  Living Better With Heart Failure Booklet, HF zone tool, & Daily Weight Tracker Tool.  Patient has scale at home: Yes Patient has pill box at home: Yes    High Risk Criteria for Readmission and/or Poor Patient Outcomes: Heart failure hospital admissions (last 6 months): 0  No Show rate: 5% Difficult social situation: No, lives with her husband and daughter Demonstrates medication adherence: No, per youngest daughter, patient has a phobia about running out of her meds due to costs, so she will take her medication 1 day and then skip a day or 2. )  Primary Language: Carloyn (daughter speaks english)  Literacy level: Reading, writing.   Barriers of Care:   Medication compliance and costs Diet/ fluid restrictions (salt usage)  Daily weights Smoking ( not interested in quitting per daughter)   Considerations/Referrals:   Referral made to Heart Failure Pharmacist Stewardship: Yes, costs of medications  Referral made to Heart Failure CSW/NCM TOC: NA Referral made to Heart & Vascular TOC clinic: Yes, 04/22/2024 @ 9 am.   Items for Follow-up on DC/TOC: Medication costs and compliance Continued HF education  Diet/ fluid restrictions/ daily weights Smoking cessation    Stephane Haddock, BSN, RN Heart Failure Teacher, Adult Education Only

## 2024-04-12 NOTE — Plan of Care (Signed)
  Problem: Education: Goal: Ability to describe self-care measures that may prevent or decrease complications (Diabetes Survival Skills Education) will improve Outcome: Progressing   Problem: Coping: Goal: Ability to adjust to condition or change in health will improve Outcome: Progressing   Problem: Activity: Goal: Risk for activity intolerance will decrease Outcome: Progressing   

## 2024-04-12 NOTE — Plan of Care (Signed)
   Problem: Coping: Goal: Ability to adjust to condition or change in health will improve Outcome: Progressing

## 2024-04-12 NOTE — Interval H&P Note (Signed)
 History and Physical Interval Note:  04/12/2024 11:18 AM  Tracy Sanders  has presented today for surgery, with the diagnosis of herat failure - nstemi.  The various methods of treatment have been discussed with the patient and family. After consideration of risks, benefits and other options for treatment, the patient has consented to  Procedures: RIGHT/LEFT HEART CATH AND CORONARY ANGIOGRAPHY (N/A) as a surgical intervention.  The patient's history has been reviewed, patient examined, no change in status, stable for surgery.  I have reviewed the patient's chart and labs.  Questions were answered to the patient's satisfaction.     Ozell Fell

## 2024-04-12 NOTE — Progress Notes (Signed)
 PHARMACY - ANTICOAGULATION CONSULT NOTE  Pharmacy Consult for heparin   Indication: LV thrombus   Allergies[1]  Patient Measurements: Height: 4' 10 (147.3 cm) Weight: 72.4 kg (159 lb 11.2 oz) IBW/kg (Calculated) : 40.9 HEPARIN  DW (KG): 57.5  Vital Signs: Temp: 97.4 F (36.3 C) (02/03 0715) Temp Source: Oral (02/03 0715) BP: 107/69 (02/03 0715) Pulse Rate: 97 (02/03 0715)  Labs: Recent Labs    04/11/24 0132 04/11/24 1012 04/11/24 1844 04/12/24 0644  HGB 14.5 14.8  --  14.8  HCT 43.3 43.4  --  44.6  PLT 177 192  --  191  HEPARINUNFRC  --   --  0.14* 0.42  CREATININE 0.61 0.63  --  0.68    Estimated Creatinine Clearance: 63.2 mL/min (by C-G formula based on SCr of 0.68 mg/dL).  Assessment: 58 yof presenting with chest pain and SOB. ECHO finding swirling in apex with likely small thrombus noted in setting of reduced EF. No AC PTA.   Hgb 14.8, plt 192. No s/sx of bleeding. Trop 96 up to 172 now.   Heparin  level is therapeutic at 0.42 on 1000 units/hr. No bleeding noted, CBC stable.  Goal of Therapy:  Heparin  level 0.3-0.7 units/ml Monitor platelets by anticoagulation protocol: Yes   Plan:  Continue heparin  infusion at 1000 units/hr 6 hr confirmatory heparin  level Daily heparin  level and CBC Monitor for signs/symptoms of bleeding F/u after cath  Thank you for involving pharmacy in this patient's care.  Delon Sax, PharmD, BCPS Clinical Pharmacist Clinical phone for 04/12/2024 is [phone] 04/12/2024 8:51 AM       [1]  Allergies Allergen Reactions   Morphine  Other (See Comments)    Passes out   Other     a cold medicine  - not sure which one

## 2024-04-13 ENCOUNTER — Other Ambulatory Visit (HOSPITAL_COMMUNITY): Payer: Self-pay

## 2024-04-13 DIAGNOSIS — E785 Hyperlipidemia, unspecified: Secondary | ICD-10-CM

## 2024-04-13 DIAGNOSIS — I214 Non-ST elevation (NSTEMI) myocardial infarction: Secondary | ICD-10-CM

## 2024-04-13 DIAGNOSIS — E1169 Type 2 diabetes mellitus with other specified complication: Secondary | ICD-10-CM

## 2024-04-13 DIAGNOSIS — I5021 Acute systolic (congestive) heart failure: Secondary | ICD-10-CM

## 2024-04-13 DIAGNOSIS — I513 Intracardiac thrombosis, not elsewhere classified: Secondary | ICD-10-CM

## 2024-04-13 LAB — POCT I-STAT EG7
Acid-Base Excess: 0 mmol/L (ref 0.0–2.0)
Bicarbonate: 26.4 mmol/L (ref 20.0–28.0)
Calcium, Ion: 1.21 mmol/L (ref 1.15–1.40)
HCT: 41 % (ref 36.0–46.0)
Hemoglobin: 13.9 g/dL (ref 12.0–15.0)
O2 Saturation: 68 %
Potassium: 3.6 mmol/L (ref 3.5–5.1)
Sodium: 138 mmol/L (ref 135–145)
TCO2: 28 mmol/L (ref 22–32)
pCO2, Ven: 46.4 mmHg (ref 44–60)
pH, Ven: 7.363 (ref 7.25–7.43)
pO2, Ven: 37 mmHg (ref 32–45)

## 2024-04-13 LAB — POCT I-STAT 7, (LYTES, BLD GAS, ICA,H+H)
Acid-Base Excess: 1 mmol/L (ref 0.0–2.0)
Bicarbonate: 25.8 mmol/L (ref 20.0–28.0)
Calcium, Ion: 1.24 mmol/L (ref 1.15–1.40)
HCT: 44 % (ref 36.0–46.0)
Hemoglobin: 15 g/dL (ref 12.0–15.0)
O2 Saturation: 93 %
Potassium: 4.1 mmol/L (ref 3.5–5.1)
Sodium: 138 mmol/L (ref 135–145)
TCO2: 27 mmol/L (ref 22–32)
pCO2 arterial: 42 mmHg (ref 32–48)
pH, Arterial: 7.396 (ref 7.35–7.45)
pO2, Arterial: 66 mmHg — ABNORMAL LOW (ref 83–108)

## 2024-04-13 LAB — CBC
HCT: 42.6 % (ref 36.0–46.0)
Hemoglobin: 14 g/dL (ref 12.0–15.0)
MCH: 27.5 pg (ref 26.0–34.0)
MCHC: 32.9 g/dL (ref 30.0–36.0)
MCV: 83.5 fL (ref 80.0–100.0)
Platelets: 181 10*3/uL (ref 150–400)
RBC: 5.1 MIL/uL (ref 3.87–5.11)
RDW: 14.1 % (ref 11.5–15.5)
WBC: 8.4 10*3/uL (ref 4.0–10.5)
nRBC: 0 % (ref 0.0–0.2)

## 2024-04-13 LAB — BASIC METABOLIC PANEL WITH GFR
Anion gap: 14 (ref 5–15)
BUN: 21 mg/dL — ABNORMAL HIGH (ref 6–20)
CO2: 22 mmol/L (ref 22–32)
Calcium: 8.9 mg/dL (ref 8.9–10.3)
Chloride: 103 mmol/L (ref 98–111)
Creatinine, Ser: 0.71 mg/dL (ref 0.44–1.00)
GFR, Estimated: >60 mL/min
Glucose, Bld: 146 mg/dL — ABNORMAL HIGH (ref 70–99)
Potassium: 3.6 mmol/L (ref 3.5–5.1)
Sodium: 139 mmol/L (ref 135–145)

## 2024-04-13 LAB — GLUCOSE, CAPILLARY
Glucose-Capillary: 169 mg/dL — ABNORMAL HIGH (ref 70–99)
Glucose-Capillary: 302 mg/dL — ABNORMAL HIGH (ref 70–99)
Glucose-Capillary: 236 mg/dL — ABNORMAL HIGH (ref 70–99)
Glucose-Capillary: 186 mg/dL — ABNORMAL HIGH (ref 70–99)

## 2024-04-13 MED ORDER — NICOTINE 14 MG/24HR TD PT24
14.0000 mg | MEDICATED_PATCH | Freq: Every day | TRANSDERMAL | Status: DC
  Administered 2024-04-14: 14 mg via TRANSDERMAL

## 2024-04-13 MED ORDER — APIXABAN 5 MG PO TABS
5.0000 mg | ORAL_TABLET | Freq: Two times a day (BID) | ORAL | Status: DC
  Administered 2024-04-13 – 2024-04-14 (×3): 5 mg via ORAL
  Filled 2024-04-13 (×3): qty 1

## 2024-04-13 MED ORDER — LIVING WELL WITH DIABETES BOOK
Freq: Once | Status: AC
  Filled 2024-04-13: qty 1

## 2024-04-13 MED ORDER — NICOTINE 14 MG/24HR TD PT24
14.0000 mg | MEDICATED_PATCH | Freq: Every day | TRANSDERMAL | Status: DC
  Administered 2024-04-13: 14 mg via TRANSDERMAL
  Filled 2024-04-13 (×2): qty 1

## 2024-04-13 MED ORDER — METOPROLOL SUCCINATE ER 25 MG PO TB24
25.0000 mg | ORAL_TABLET | Freq: Every day | ORAL | Status: DC
  Administered 2024-04-13 – 2024-04-14 (×2): 25 mg via ORAL
  Filled 2024-04-13 (×2): qty 1

## 2024-04-13 MED ORDER — SPIRONOLACTONE 12.5 MG HALF TABLET
12.5000 mg | ORAL_TABLET | Freq: Every day | ORAL | Status: DC
  Administered 2024-04-13: 12.5 mg via ORAL
  Filled 2024-04-13: qty 1

## 2024-04-13 MED ORDER — CLOPIDOGREL BISULFATE 75 MG PO TABS
75.0000 mg | ORAL_TABLET | Freq: Every day | ORAL | Status: DC
  Administered 2024-04-14: 75 mg via ORAL
  Filled 2024-04-13: qty 1

## 2024-04-13 MED ORDER — CLOPIDOGREL BISULFATE 75 MG PO TABS
600.0000 mg | ORAL_TABLET | Freq: Once | ORAL | Status: AC
  Administered 2024-04-13: 600 mg via ORAL
  Filled 2024-04-13: qty 8

## 2024-04-13 NOTE — Plan of Care (Signed)
  Problem: Education: Goal: Ability to describe self-care measures that may prevent or decrease complications (Diabetes Survival Skills Education) will improve Outcome: Not Progressing Goal: Individualized Educational Video(s) Outcome: Not Progressing   Problem: Coping: Goal: Ability to adjust to condition or change in health will improve Outcome: Not Progressing   Problem: Fluid Volume: Goal: Ability to maintain a balanced intake and output will improve Outcome: Not Progressing   Problem: Health Behavior/Discharge Planning: Goal: Ability to identify and utilize available resources and services will improve Outcome: Not Progressing Goal: Ability to manage health-related needs will improve Outcome: Not Progressing   Problem: Metabolic: Goal: Ability to maintain appropriate glucose levels will improve Outcome: Not Progressing   Problem: Nutritional: Goal: Maintenance of adequate nutrition will improve Outcome: Not Progressing Goal: Progress toward achieving an optimal weight will improve Outcome: Not Progressing   Problem: Skin Integrity: Goal: Risk for impaired skin integrity will decrease Outcome: Not Progressing   Problem: Tissue Perfusion: Goal: Adequacy of tissue perfusion will improve Outcome: Not Progressing   Problem: Education: Goal: Knowledge of General Education information will improve Description: Including pain rating scale, medication(s)/side effects and non-pharmacologic comfort measures Outcome: Not Progressing   Problem: Health Behavior/Discharge Planning: Goal: Ability to manage health-related needs will improve Outcome: Not Progressing   Problem: Clinical Measurements: Goal: Ability to maintain clinical measurements within normal limits will improve Outcome: Not Progressing Goal: Will remain free from infection Outcome: Not Progressing Goal: Diagnostic test results will improve Outcome: Not Progressing Goal: Respiratory complications will  improve Outcome: Not Progressing Goal: Cardiovascular complication will be avoided Outcome: Not Progressing

## 2024-04-13 NOTE — Progress Notes (Signed)
" ° °  Heart Failure Stewardship Pharmacist Progress Note   PCP: Default, Provider, MD PCP-Cardiologist: Georganna Archer, MD    HPI:  59 yo F with PMH of HTN, HLD, T2DM, and hypothyroidism.   Presented to the ED on 2/1 with chest tightness and shortness of breath. Lactic acid 2.5, proBNP 959. CXR with cardiomegaly and mild perihilar vascular congestion without overt pulmonary edema. ECHO 2/2 with LVEF 30-35%, RWMA, swirling of contrast in the apex, likely small thrombus. Underwent R/LHC on 2/3 and found to have critical distal RCA stenosis s/p PCI with DES and moderate to severe prox LAD stenosis. RA 6, PA 13, wedge 6, CO 4.3, CI 2.6.   Reports improvement in breathing. Denies chest pain. No LE edema. States she was taking medications sporadically prior to admission (would take every couple days because she thought if she would take them daily that she would run out). Agreeable to using [facility] TOC pharmacy at discharge. Reviewed GDMT and goals of therapy.   Current HF Medications: Beta Blocker: metoprolol  XL 25 mg daily ACE/ARB/ARNI: losartan  25 mg daily MRA: spironolactone  25 mg daily SGLT2i: Jardiance  10 mg daily  Prior to admission HF Medications: ACE/ARB/ARNI: lisinopril  20 mg daily  Pertinent Lab Values: Serum creatinine 0.60, BUN 21, Potassium 3.8, Sodium 139, proBNP 959, Magnesium  1.9, A1c 8.2   Vital Signs: Weight: 161 lbs (admission weight: 159 lbs) Blood pressure: 120/80s  Heart rate: 80-90s  I/O: incomplete   Medication Assistance / Insurance Benefits Check: Does the patient have prescription insurance?  Yes Type of insurance plan: [employer] commercial insurance  Outpatient Pharmacy:  Prior to admission outpatient pharmacy: [facility] Is the patient willing to use [facility] TOC pharmacy at discharge? Yes Is the patient willing to transition their outpatient pharmacy to utilize a [facility] outpatient pharmacy?   No    Assessment: 1. Acute systolic CHF (LVEF 30-35%), due to  ICM. NYHA class II symptoms. - Volume status ok. Wedge 6 on cath.  - Continue metoprolol  XL 25 mg daily - Continue losartan  25 mg daily - Agree with increasing spironolactone  to 25 mg daily - Continue Jardiance  10 mg daily, stop Invokana  at discharge  Plan: 1) Medication changes recommended at this time: - Stop Invokana  at discharge  2) Patient assistance: - Farxiga copay $354 (deductible remaining) - Jardiance  copay $25 - Entresto copay $121 (deductible remaining)  3)  Education  - Patient has been educated on current HF medications and potential additions to HF medication regimen - Patient verbalizes understanding that over the next few months, these medication doses may change and more medications may be added to optimize HF regimen - Patient has been educated on basic disease state pathophysiology and goals of therapy   Duwaine Plant, PharmD, BCPS Heart Failure Stewardship Pharmacist Phone [phone]   "

## 2024-04-13 NOTE — Progress Notes (Addendum)
 " PROGRESS NOTE    Tracy Sanders  FMW:[id number] DOB: Oct 05, 1965 DOA: 04/10/2024 PCP: Default, Provider, MD  59/F with type 2 diabetes mellitus, hypertension, dyslipidemia, hypothyroidism, long-term smoker presented to the ED with 2-week history of chest pressure heaviness pain and shortness of breath, family also reports increasing abdominal distention, intermittent leg weakness and numbness, occasional facial droop. - In the ED vital stable, glucose 312, lactate 2.5, troponin 96, proBNP 959, CT abdomen pelvis without acute findings, chest x-ray with pulmonary vascular congestion, CTA head and neck with intracranial atherosclerosis, carotid artery disease. -MRI brain negative for acute findings - Admitted, started on diuretics - Echo noted EF down to 30 to 35% with + WMA and concern for small LV thrombus - R/LHC with low filling pressures, RCA stenosis status post PCI and stenting, 70% LAD disease   Subjective: Feels better today, urinated a lot yesterday  Assessment and Plan:  NSTEMI - Presented with chest pain, rising troponin and ischemic changes on EKG - Treated with IV heparin , underwent R/LHC, noted RCA stenosis status post PCI and stenting, and 70% moderate to severe LAD stenosis, medical management recommended - Continue aspirin  Plavix , statin - Adding metoprolol  today - Discontinue aspirin  in 4 weeks and Plavix  after 6 months, will remain on Eliquis  long-term  Acute systolic CHF, new diagnosis - Echo noted EF of 30-35%, wall motion abnormality, concern for small LV thrombus, RV is normal - Improved with diuresis, now euvolemic, RHC with low filling pressures, discontinue further diuretics - Continue Jardiance , losartan , adding low-dose Aldactone  today - Discharge planning, increase activity, home tomorrow  LV thrombus - New in the setting of above, now on IV heparin , start Eliquis  today  Carotid artery disease Intracranial atherosclerosis - Continue aspirin , statin -  Will need follow-up with vascular surgery, smoking cessation counseling   Uncontrolled type 2 diabetes mellitus with hyperglycemia - A1c is 8.2 - Continue glargine, meal coverage, Jardiance    Tobacco abuse, suspected COPD - Counseled   Hypothyroidism Continue Synthroid    DVT prophylaxis: Eliquis  Code Status: Full code Family Communication: No family at bedside updated daughter Disposition Plan: Home likely tomorrow  Antimicrobials:    Objective: Vitals:   04/13/24 0303 04/13/24 0409 04/13/24 0812 04/13/24 0820  BP: 108/88  (!) 142/91 (!) 142/91  Pulse: 93   (!) 102  Resp: 19  19   Temp: 97.9 F (36.6 C)  97.8 F (36.6 C)   TempSrc: Oral  Oral   SpO2: 92%  95%   Weight:  73.3 kg    Height:        Intake/Output Summary (Last 24 hours) at 04/13/2024 1107 Last data filed at 04/12/2024 2024 Gross per 24 hour  Intake 3427 ml  Output --  Net 3427 ml   Filed Weights   04/11/24 1520 04/12/24 0330 04/13/24 0409  Weight: 72.5 kg 72.4 kg 73.3 kg    Examination:  General exam: Appears calm and comfortable, AAO x 3 Respiratory system: Poor air movement Cardiovascular system: S1 & S2 heard, RRR.  Abd: nondistended, soft and nontender.Normal bowel sounds heard. Central nervous system: Alert and oriented. No focal neurological deficits. Extremities: no edema Skin: No rashes Psychiatry:  Mood & affect appropriate.     Data Reviewed:   CBC: Recent Labs  Lab 04/10/24 1827 04/11/24 0132 04/11/24 1012 04/12/24 0644 04/13/24 0203  WBC 7.7 8.2 8.1 8.6 8.4  HGB 13.6 14.5 14.8 14.8 14.0  HCT 40.2 43.3 43.4 44.6 42.6  MCV 82.4 83.9 82.4 82.7 83.5  PLT  173 177 192 191 181   Basic Metabolic Panel: Recent Labs  Lab 04/10/24 1827 04/11/24 0132 04/11/24 1012 04/12/24 0644 04/13/24 0203  NA 137  --  138 137 139  K 4.5  --  3.9 4.2 3.6  CL 101  --  100 97* 103  CO2 22  --  24 27 22   GLUCOSE 312*  --  265* 207* 146*  BUN 20  --  14 22* 21*  CREATININE 0.91 0.61  0.63 0.68 0.71  CALCIUM  9.6  --  9.6 10.3 8.9  MG  --   --  1.9  --   --   PHOS  --   --  4.2  --   --    GFR: Estimated Creatinine Clearance: 63.6 mL/min (by C-G formula based on SCr of 0.71 mg/dL). Liver Function Tests: Recent Labs  Lab 04/10/24 1827 04/11/24 1012 04/12/24 0644  AST 38 33 33  ALT 27 28 34  ALKPHOS 64 68 73  BILITOT 0.2 0.4 0.6  PROT 6.9 7.6 7.7  ALBUMIN 3.9 4.4 4.4   Recent Labs  Lab 04/10/24 1827  LIPASE 51   No results for input(s): AMMONIA in the last 168 hours. Coagulation Profile: No results for input(s): INR, PROTIME in the last 168 hours. Cardiac Enzymes: No results for input(s): CKTOTAL, CKMB, CKMBINDEX, TROPONINI in the last 168 hours. BNP (last 3 results) Recent Labs    04/10/24 2000  PROBNP 959.0*   HbA1C: Recent Labs    04/11/24 0127  HGBA1C 8.2*   CBG: Recent Labs  Lab 04/12/24 1057 04/12/24 1336 04/12/24 1713 04/12/24 2202 04/13/24 0631  GLUCAP 204* 163* 176* 161* 169*   Lipid Profile: Recent Labs    04/12/24 0644  CHOL 307*  HDL 47  LDLCALC 200*  TRIG 300*  CHOLHDL 6.6   Thyroid Function Tests: Recent Labs    04/11/24 1012  TSH 4.580*   Anemia Panel: No results for input(s): VITAMINB12, FOLATE, FERRITIN, TIBC, IRON, RETICCTPCT in the last 72 hours. Urine analysis:    Component Value Date/Time   COLORURINE STRAW (A) 03/16/2024 1547   APPEARANCEUR CLEAR 03/16/2024 1547   APPEARANCEUR Cloudy (A) 04/27/2020 1514   LABSPEC 1.015 03/16/2024 1547   PHURINE 5.5 03/16/2024 1547   GLUCOSEU NEGATIVE 03/16/2024 1547   HGBUR NEGATIVE 03/16/2024 1547   BILIRUBINUR NEGATIVE 03/16/2024 1547   BILIRUBINUR Negative 04/27/2020 1514   KETONESUR NEGATIVE 03/16/2024 1547   PROTEINUR NEGATIVE 03/16/2024 1547   UROBILINOGEN 0.2 08/17/2017 1430   NITRITE NEGATIVE 03/16/2024 1547   LEUKOCYTESUR NEGATIVE 03/16/2024 1547   Sepsis Labs: @LABRCNTIP (procalcitonin:4,lacticidven:4)  ) Recent Results  (from the past 240 hours)  Resp panel by RT-PCR (RSV, Flu A&B, Covid) Anterior Nasal Swab     Status: None   Collection Time: 04/10/24  6:28 PM   Specimen: Anterior Nasal Swab  Result Value Ref Range Status   SARS Coronavirus 2 by RT PCR NEGATIVE NEGATIVE Final   Influenza A by PCR NEGATIVE NEGATIVE Final   Influenza B by PCR NEGATIVE NEGATIVE Final    Comment: (NOTE) The Xpert Xpress SARS-CoV-2/FLU/RSV plus assay is intended as an aid in the diagnosis of influenza from Nasopharyngeal swab specimens and should not be used as a sole basis for treatment. Nasal washings and aspirates are unacceptable for Xpert Xpress SARS-CoV-2/FLU/RSV testing.  Fact Sheet for Patients: bloggercourse.com  Fact Sheet for Healthcare Providers: seriousbroker.it  This test is not yet approved or cleared by the [place]  FDA and has been  authorized for detection and/or diagnosis of SARS-CoV-2 by FDA under an Emergency Use Authorization (EUA). This EUA will remain in effect (meaning this test can be used) for the duration of the COVID-19 declaration under Section 564(b)(1) of the Act, 21 U.S.C. section 360bbb-3(b)(1), unless the authorization is terminated or revoked.     Resp Syncytial Virus by PCR NEGATIVE NEGATIVE Final    Comment: (NOTE) Fact Sheet for Patients: bloggercourse.com  Fact Sheet for Healthcare Providers: seriousbroker.it  This test is not yet approved or cleared by the [place]  FDA and has been authorized for detection and/or diagnosis of SARS-CoV-2 by FDA under an Emergency Use Authorization (EUA). This EUA will remain in effect (meaning this test can be used) for the duration of the COVID-19 declaration under Section 564(b)(1) of the Act, 21 U.S.C. section 360bbb-3(b)(1), unless the authorization is terminated or revoked.  Performed at [facility], [address], [place], [place] [address]      Radiology Studies: ECHOCARDIOGRAM COMPLETE Result Date: 04/11/2024    ECHOCARDIOGRAM REPORT   Patient Name:   TYRESHA FEDE Date of Exam: 04/11/2024 Medical Rec #:  [id number]       Height:       58.0 in Accession #:    [id number]      Weight:       159.8 lb Date of Birth:  Oct 18, 1965       BSA:          1.656 m Patient Age:    58 years        BP:           127/91 mmHg Patient Gender: F               HR:           96 bpm. Exam Location:  Inpatient Procedure: 2D Echo, Cardiac Doppler, Color Doppler and Intracardiac            Opacification Agent (Both Spectral and Color Flow Doppler were            utilized during procedure). Indications:    I50.40* Unspecified combined systolic (congestive) and diastolic                 (congestive) heart failure  History:        Patient has no prior history of Echocardiogram examinations.                 CHF, Signs/Symptoms:Chest Pain, Shortness of Breath, Dyspnea and                 Dizziness/Lightheadedness; Risk Factors:Current Smoker,                 Diabetes, Hypertension and Dyslipidemia.  Sonographer:    Ellouise Mose RDCS Referring Phys: [id number] OLADAPO ADEFESO  Sonographer Comments: Technically difficult study due to poor echo windows. Physician in room talking with patient and family during study. IMPRESSIONS  1. Normal inferior and inferolateral wall motion, otherwise global hypokinesis, concerning for LAD infarct or multivessel disease, not classic for takotsubo. Swirling of contrast in the apex with likely small thrombus noted. Left ventricular ejection fraction, by estimation, is 30 to 35%. The left ventricle has moderately decreased function. The left ventricle demonstrates regional wall motion abnormalities (see scoring diagram/findings for description). There is mild concentric left ventricular hypertrophy. Indeterminate diastolic filling due to E-A fusion.  2. Right ventricular systolic function is normal. The right ventricular size  is normal.  3. The  mitral valve is degenerative. Trivial mitral valve regurgitation. No evidence of mitral stenosis.  4. The aortic valve is tricuspid. Aortic valve regurgitation is not visualized. Aortic valve sclerosis is present, with no evidence of aortic valve stenosis.  5. The inferior vena cava is normal in size with greater than 50% respiratory variability, suggesting right atrial pressure of 3 mmHg. Comparison(s): No prior Echocardiogram. FINDINGS  Left Ventricle: Normal inferior and inferolateral wall motion, otherwise global hypokinesis, concerning for LAD infarct or multivessel disease, not classic for takotsubo. Swirling of contrast in the apex with likely small thrombus noted. Left ventricular ejection fraction, by estimation, is 30 to 35%. The left ventricle has moderately decreased function. The left ventricle demonstrates regional wall motion abnormalities. Definity  contrast agent was given IV to delineate the left ventricular endocardial borders. The left ventricular internal cavity size was normal in size. There is mild concentric left ventricular hypertrophy. Indeterminate diastolic filling due to E-A fusion. Normal left ventricular filling pressure. Right Ventricle: The right ventricular size is normal. No increase in right ventricular wall thickness. Right ventricular systolic function is normal. Left Atrium: Left atrial size was normal in size. Right Atrium: Right atrial size was normal in size. Pericardium: There is no evidence of pericardial effusion. Presence of epicardial fat layer. Mitral Valve: The mitral valve is degenerative in appearance. Trivial mitral valve regurgitation. No evidence of mitral valve stenosis. Tricuspid Valve: The tricuspid valve is normal in structure. Tricuspid valve regurgitation is not demonstrated. No evidence of tricuspid stenosis. Aortic Valve: The aortic valve is tricuspid. Aortic valve regurgitation is not visualized. Aortic valve sclerosis is present, with  no evidence of aortic valve stenosis. Pulmonic Valve: The pulmonic valve was normal in structure. Pulmonic valve regurgitation is not visualized. No evidence of pulmonic stenosis. Aorta: The aortic root and ascending aorta are structurally normal, with no evidence of dilitation. Venous: The inferior vena cava is normal in size with greater than 50% respiratory variability, suggesting right atrial pressure of 3 mmHg. IAS/Shunts: No atrial level shunt detected by color flow Doppler.  LEFT VENTRICLE PLAX 2D LVIDd:         4.85 cm      Diastology LVIDs:         4.05 cm      LV e' medial:    6.66 cm/s LV PW:         1.10 cm      LV E/e' medial:  15.6 LV IVS:        1.00 cm      LV e' lateral:   4.13 cm/s LVOT diam:     2.10 cm      LV E/e' lateral: 25.2 LV SV:         53 LV SV Index:   32 LVOT Area:     3.46 cm  LV Volumes (MOD) LV vol d, MOD A2C: 92.0 ml LV vol d, MOD A4C: 105.0 ml LV vol s, MOD A2C: 67.1 ml LV vol s, MOD A4C: 64.4 ml LV SV MOD A2C:     24.9 ml LV SV MOD A4C:     105.0 ml LV SV MOD BP:      32.6 ml RIGHT VENTRICLE             IVC RV S prime:     12.40 cm/s  IVC diam: 1.90 cm TAPSE (M-mode): 1.5 cm  PULMONARY VEINS                             Diastolic Velocity: 30.70 cm/s                             S/D Velocity:       1.00                             Systolic Velocity:  30.20 cm/s LEFT ATRIUM             Index        RIGHT ATRIUM          Index LA diam:        2.70 cm 1.63 cm/m   RA Area:     6.67 cm LA Vol (A2C):   27.8 ml 16.79 ml/m  RA Volume:   9.56 ml  5.77 ml/m LA Vol (A4C):   35.0 ml 21.14 ml/m LA Biplane Vol: 32.7 ml 19.75 ml/m  AORTIC VALVE LVOT Vmax:   101.60 cm/s LVOT Vmean:  67.233 cm/s LVOT VTI:    0.153 m  AORTA Ao Asc diam: 3.40 cm MITRAL VALVE MV Area (PHT): 6.54 cm     SHUNTS MV Decel Time: 116 msec     Systemic VTI:  0.15 m MV E velocity: 104.00 cm/s  Systemic Diam: 2.10 cm Morene Brownie Electronically signed by Morene Brownie Signature Date/Time:  04/11/2024/12:00:36 PM    Final    CT ANGIO HEAD NECK W WO CM Result Date: 04/11/2024 EXAM: CTA HEAD AND NECK WITHOUT AND WITH 04/11/2024 06:21:00 AM TECHNIQUE: CTA of the head and neck was performed without and with the administration of 75 mL iohexol  (OMNIPAQUE ) 350 MG/ML injection. Multiplanar 2D and/or 3D reformatted images are provided for review. Automated exposure control, iterative reconstruction, and/or weight based adjustment of the mA/kV was utilized to reduce the radiation dose to as low as reasonably achievable. Stenosis of the internal carotid arteries measured using NASCET criteria. COMPARISON: CT of the head dated 04/11/2024. CLINICAL HISTORY: Neuro deficit, acute, stroke suspected. Acute neurologic deficit; stroke suspected. FINDINGS: CTA NECK: AORTIC ARCH AND ARCH VESSELS: No dissection or arterial injury. No significant stenosis of the brachiocephalic or subclavian arteries. CERVICAL CAROTID ARTERIES: Moderate calcific plaque present within the origin of the right internal carotid artery, with less than 50% luminal stenosis. There is calcific plaque within the left carotid bulb with less than 20% stenosis of the origin of the left internal carotid artery. No dissection or arterial injury. CERVICAL VERTEBRAL ARTERIES: The vertebral arteries are codominant and normal in caliber. There is mild calcific plaque within the V4 segments. There is less than 50% stenosis of the left distal V4 segment. No dissection or arterial injury. LUNGS AND MEDIASTINUM: Unremarkable. SOFT TISSUES: No acute abnormality. BONES: No acute abnormality. CTA HEAD: ANTERIOR CIRCULATION: Internal Carotid Arteries (ICA): Right ICA: Moderate calcific atheromatous disease within the carotid siphon. Moderate stenosis of the anterior genu and supraclinoid segments of the right internal carotid artery, approximately 60-70%. Left ICA: Moderate calcific atheromatous disease within the carotid siphon. Moderate stenosis of the  supraclinoid segment of the left internal carotid artery, approximately 60%. Middle Cerebral Arteries (MCA): Right MCA: Mild irregular stenosis of the M1 segment. Left MCA: No significant stenosis. Anterior Cerebral Arteries (ACA): No significant stenosis. No aneurysm. POSTERIOR CIRCULATION: Posterior Cerebral  Arteries (PCA): Right PCA: Moderate irregular stenosis of the P1 and P2 segments. Left PCA: No significant stenosis. Basilar Artery: No significant stenosis. Vertebral Arteries: No significant stenosis of the intracranial segments. No aneurysm. OTHER: No dural venous sinus thrombosis on this non-dedicated study. IMPRESSION: 1. Moderate stenosis of the anterior genu and supraclinoid segments of the right internal carotid artery, approximately 60 to 70%. 2. Moderate stenosis of the supraclinoid segment of the left internal carotid artery, approximately 60%. 3. Mild irregular stenosis of the M1 segment of the right middle cerebral artery. 4. Moderate irregular stenosis of the P1 and P2 segments of the right posterior cerebral artery. 5. Moderate calcific plaque at the origin of the right internal carotid artery with less than 50% luminal stenosis. 6. Mild calcific plaque within the V4 segments with less than 50% stenosis of the left distal V4 segment. Electronically signed by: Evalene Coho MD 04/11/2024 07:20 AM EST RP Workstation: [id number]   MR BRAIN WO CONTRAST Result Date: 04/11/2024 EXAM: MRI BRAIN WITHOUT CONTRAST 04/11/2024 06:15:11 AM TECHNIQUE: Multiplanar multisequence MRI of the head/brain was performed without the administration of intravenous contrast. COMPARISON: CT of the head dated 04/11/2024. CLINICAL HISTORY: Neuro deficit, acute, stroke suspected. Acute neurologic deficit; stroke suspected. FINDINGS: BRAIN AND VENTRICLES: There is no restricted diffusion to indicate acute or recent infarction. There are multiple foci of increased T2 signal within the deep cerebral white matter  bilaterally. There is a partially empty sella. No intracranial hemorrhage. No mass. No midline shift. No hydrocephalus. Normal flow voids. ORBITS: No significant abnormality. SINUSES AND MASTOIDS: No significant abnormality. BONES AND SOFT TISSUES: Normal marrow signal. No soft tissue abnormality. IMPRESSION: 1. No restricted diffusion to indicate acute or recent infarction. 2. Multiple foci of increased T2 signal within the deep cerebral white matter bilaterally, most commonly associated with ischemic small vessel disease. 3. Partially empty sella. Electronically signed by: Evalene Coho MD 04/11/2024 06:45 AM EST RP Workstation: [id number]   CT HEAD WO CONTRAST ( ) XAM: CT HEAD WITHOUT CONTRAST 04/11/2024 04:22:00 AM TECHNIQUE: CT of the head was performed without the administration of intravenous contrast. Automated exposure control, iterative reconstruction, and/or weight based adjustment of the mA/kV was utilized to reduce the radiation dose to as low as reasonably achievable. COMPARISON: None available. CLINICAL HISTORY: Neuro deficit, acute, stroke suspected. Acute neurological deficit; stroke suspected. FINDINGS: BRAIN AND VENTRICLES: No acute hemorrhage. No evidence of acute infarct. No hydrocephalus. No extra-axial collection. No mass effect or midline shift. There is mild global cerebral volume loss, only slight cerebellar atrophy. There are no findings of significant small vessel disease in the cerebral white matter. There is trace mineralization in the basal ganglia. Calcific plaques in both distal vertebral arteries and carotid siphons. On the left, there is concern for a short segment hyperdense M2 left MCA branch in the sylvian fissure on axial series 3 image 15, coronal series 7 image 26, and sagittal reconstruction series 9 image 42. CTA head is recommended to exclude large vessel occlusion. No other hyperdense vessel is suspected. Gray-white matter differentiation remains preserved  throughout. ORBITS: No acute abnormality. SINUSES: There is trace mucosal disease in the right sphenoid air cell. Other sinuses are clear. SOFT TISSUES AND SKULL: No acute soft tissue abnormality. No skull fracture. Mastoid air cells and middle ears are clear with bilateral mastoid bone underpneumatization. IMPRESSION: 1. Concern for a hyperdense M2 left MCA branch in the sylvian fissure. CTA head is recommended to exclude large vessel occlusion. No other hyperdense vessel is suspected.  2. No other evidence of acute intracranial CT findings. 3. Bilateral mastoid bone underpneumatization. 4. PRA is attempting to reach the ordering physician at the time of signing. Electronically signed by: Francis Quam MD 04/11/2024 04:45 AM EST RP Workstation: [id number]   CT ABDOMEN PELVIS W CONTRAST Result Date: 04/10/2024 EXAM: CT ABDOMEN AND PELVIS WITH CONTRAST 04/10/2024 10:14:00 PM TECHNIQUE: CT of the abdomen and pelvis was performed with the administration of 75 mL of iohexol  (OMNIPAQUE ) 350 MG/ML injection. Multiplanar reformatted images are provided for review. Automated exposure control, iterative reconstruction, and/or weight-based adjustment of the mA/kV was utilized to reduce the radiation dose to as low as reasonably achievable. COMPARISON: Comparison with 03/16/2024. CLINICAL HISTORY: Abdominal pain, acute, nonlocalized. Acute, nonlocalized abdominal pain. FINDINGS: LOWER CHEST: No acute abnormality. LIVER: Hepatic steatosis. GALLBLADDER AND BILE DUCTS: Gallbladder is unremarkable. No biliary ductal dilatation. SPLEEN: No acute abnormality. PANCREAS: No acute abnormality. ADRENAL GLANDS: No acute abnormality. KIDNEYS, URETERS AND BLADDER: No stones in the kidneys or ureters. No hydronephrosis. No perinephric or periureteral stranding. Urinary bladder is unremarkable. GI AND BOWEL: Stomach demonstrates no acute abnormality. There is no bowel obstruction. PERITONEUM AND RETROPERITONEUM: No ascites. No free air.  VASCULATURE: Aorta is normal in caliber. Aortic atherosclerotic calcification. LYMPH NODES: No lymphadenopathy. REPRODUCTIVE ORGANS: No acute abnormality. BONES AND SOFT TISSUES: No acute osseous abnormality. No focal soft tissue abnormality. IMPRESSION: 1. No acute findings in the abdomen or pelvis. 2. Hepatic steatosis. Electronically signed by: Norman Gatlin MD 04/10/2024 10:30 PM EST RP Workstation: [id number]   DG Chest Port 1 View Result Date: 04/10/2024 CLINICAL DATA:  Initial evaluation for acute chest pain. EXAM: PORTABLE CHEST 1 VIEW COMPARISON:  Prior radiograph from 03/16/2024. FINDINGS: Cardiomegaly, stable.  Mediastinal silhouette within normal limits. Lungs are hypoinflated. Mild perihilar vascular congestion without overt pulmonary edema. No pleural effusion. No focal infiltrates. No pneumothorax. Visualized soft tissues and osseous structures demonstrate no acute finding. IMPRESSION: 1. Cardiomegaly with mild perihilar vascular congestion without overt pulmonary edema. 2. No other active cardiopulmonary disease. Electronically Signed   By: Morene Hoard M.D.   On: 04/10/2024 18:56     Scheduled Meds:  apixaban   5 mg Oral BID   aspirin  EC  81 mg Oral Daily   atorvastatin   80 mg Oral Daily   clopidogrel   600 mg Oral Once   Followed by   [START ON 04/14/2024] clopidogrel   75 mg Oral Daily   empagliflozin   10 mg Oral Daily   ezetimibe   10 mg Oral Daily   insulin  aspart  0-15 Units Subcutaneous TID WC   insulin  aspart  0-5 Units Subcutaneous QHS   insulin  aspart  3 Units Subcutaneous TID WC   insulin  glargine-yfgn  20 Units Subcutaneous QHS   levothyroxine   50 mcg Oral q morning   losartan   25 mg Oral Daily   metoprolol  succinate  25 mg Oral Daily   nicotine   14 mg Transdermal Daily   sodium chloride  flush  3 mL Intravenous Q12H   spironolactone   12.5 mg Oral Daily   Continuous Infusions:  sodium chloride        LOS: 3 days    Time spent:    Sigurd Pac,  MD [facility]   04/13/2024, 11:07 AM    "

## 2024-04-13 NOTE — Discharge Instructions (Addendum)
 Information on my medicine - ELIQUIS  (apixaban )  Why was Eliquis  prescribed for you? Eliquis  was prescribed for you to treat the blood clot found in your heart and to reduce the risk of them occurring again.  What do You need to know about Eliquis  ? Take your Eliquis  TWICE DAILY - one tablet in the morning and one tablet in the evening with or without food. If you have difficulty swallowing the tablet whole please discuss with your pharmacist how to take the medication safely.  Take Eliquis  exactly as prescribed by your doctor and DO NOT stop taking Eliquis  without talking to the doctor who prescribed the medication.  Stopping may increase your risk of developing a stroke.  Refill your prescription before you run out.  After discharge, you should have regular check-up appointments with your healthcare provider that is prescribing your Eliquis .  In the future your dose may need to be changed if your kidney function or weight changes by a significant amount or as you get older.  What do you do if you miss a dose? If you miss a dose, take it as soon as you remember on the same day and resume taking twice daily.  Do not take more than one dose of ELIQUIS  at the same time to make up a missed dose.  Important Safety Information A possible side effect of Eliquis  is bleeding. You should call your healthcare provider right away if you experience any of the following: Bleeding from an injury or your nose that does not stop. Unusual colored urine (red or dark brown) or unusual colored stools (red or black). Unusual bruising for unknown reasons. A serious fall or if you hit your head (even if there is no bleeding).  Some medicines may interact with Eliquis  and might increase your risk of bleeding or clotting while on Eliquis . To help avoid this, consult your healthcare provider or pharmacist prior to using any new prescription or non-prescription medications, including herbals, vitamins,  non-steroidal anti-inflammatory drugs (NSAIDs) and supplements.  This website has more information on Eliquis  (apixaban ): [email or web address]   De-escalation of Triple Therapy Post-PCI  Underwent cardiac catheterization with placement of drug-eluting stent on 04/12/2024. Plan for triple therapy with aspirin  81 mg and clopidogrel  (Plavix ) in addition to oral anticoagulation using apixaban  (Eliquis ). Plan to discontinue aspirin  on 04/19/2024.    _________________________________________________  Heart Healthy, Consistent Carbohydrate Nutrition Therapy   A heart-healthy and consistent carbohydrate diet is recommended to manage heart disease and diabetes. To follow a heart-healthy and consistent carbohydrate diet, Eat a balanced diet with whole grains, fruits and vegetables, and lean protein sources.  Choose heart-healthy unsaturated fats. Limit saturated fats, trans fats, and cholesterol intake. Eat more plant-based or vegetarian meals using beans and soy foods for protein.  Eat whole, unprocessed foods to limit the amount of sodium (salt) you eat.  Choose a consistent amount of carbohydrate at each meal and snack. Limit refined carbohydrates especially sugar, sweets and sugar-sweetened beverages.  If you drink alcohol, do so in moderation: one serving per day (women) and two servings per day (men). o One serving is equivalent to 12 ounces beer, 5 ounces wine, or 1.5 ounces distilled spirits  Tips Tips for Choosing Heart-Healthy Fats Choose lean protein and low-fat dairy foods to reduce saturated fat intake. Saturated fat is usually found in animal-based protein and is associated with certain health risks. Saturated fat is the biggest contributor to raise low-density lipoprotein (LDL) cholesterol levels. Research shows that limiting saturated fat lowers unhealthy  cholesterol levels. Eat no more than 7% of your total calories each day from saturated fat. Ask your RDN to help you  determine how much saturated fat is right for you. There are many foods that do not contain large amounts of saturated fats. Swapping these foods to replace foods high in saturated fats will help you limit the saturated fat you eat and improve your cholesterol levels. You can also try eating more plant-based or vegetarian meals. Instead of Try:  Whole milk, cheese, yogurt, and ice cream 1% or skim milk, low-fat cheese, non-fat yogurt, and low-fat ice cream  Fatty, marbled beef and pork Lean beef, pork, or venison  Poultry with skin Poultry without skin  Butter, stick margarine Reduced-fat, whipped, or liquid spreads  Coconut oil, palm oil Liquid vegetable oils: corn, canola, olive, soybean and safflower oils   Avoid foods that contain trans fats. Trans fats increase levels of LDL-cholesterol. Hydrogenated fat in processed foods is the main source of trans fats in foods.  Trans fats can be found in stick margarine, shortening, processed sweets, baked goods, some fried foods, and packaged foods made with hydrogenated oils. Avoid foods with partially hydrogenated oil on the ingredient list such as: cookies, pastries, baked goods, biscuits, crackers, microwave popcorn, and frozen dinners. Choose foods with heart healthy fats. Polyunsaturated and monounsaturated fat are unsaturated fats that may help lower your blood cholesterol level when used in place of saturated fat in your diet. Ask your RDN about taking a dietary supplement with plant sterols and stanols to help lower your cholesterol level. Research shows that substituting saturated fats with unsaturated fats is beneficial to cholesterol levels. Try these easy swaps: Instead of Try:  Butter, stick margarine, or solid shortening Reduced-fat, whipped, or liquid spreads  Beef, pork, or poultry with skin Fish and seafood  Chips, crackers, snack foods Raw or unsalted nuts and seeds or nut butters Hummus with vegetables Avocado on toast   Coconut oil, palm oil Liquid vegetable oils: corn, canola, olive, soybean and safflower oils  Limit the amount of cholesterol you eat to less than 200 milligrams per day. Cholesterol is a substance carried through the bloodstream via lipoproteins, which are known as transporters of fat. Some body functions need cholesterol to work properly, but too much cholesterol in the bloodstream can damage arteries and build up blood vessel linings (which can lead to heart attack and stroke). You should eat less than 200 milligrams cholesterol per day. People respond differently to eating cholesterol. There is no test available right now that can figure out which people will respond more to dietary cholesterol and which will respond less. For individuals with high intake of dietary cholesterol, different types of increase (none, small, moderate, large) in LDL-cholesterol levels are all possible.  Food sources of cholesterol include egg yolks and organ meats such as liver, gizzards. Limit egg yolks to two to four per week and avoid organ meats like liver and gizzards to control cholesterol intake. Tips for Choosing Heart-Healthy Carbohydrates Consume a consistent amount of carbohydrate It is important to eat foods with carbohydrates in moderation because they impact your blood glucose level. Carbohydrates can be found in many foods such as: Grains (breads, crackers, rice, pasta, and cereals)  Starchy Vegetables (potatoes, corn, and peas)  Beans and legumes  Milk, soy milk, and yogurt  Fruit and fruit juice  Sweets (cakes, cookies, ice cream, jam and jelly) Your RDN will help you set a goal for how many carbohydrate servings to eat  at your meals and snacks. For many adults, eating 3 to 5 servings of carbohydrate foods at each meal and 1 or 2 carbohydrate servings for each snack works well.  Check your blood glucose level regularly. It can tell you if you need to adjust when you eat carbohydrates. Choose foods  rich in viscous (soluble) fiber Viscous, or soluble, is found in the walls of plant cells. Viscous fiber is found only in plant-based foods. Eating foods with fiber helps to lower your unhealthy cholesterol and keep your blood glucose in range  Rich sources of viscous fiber include vegetables (asparagus, Brussels sprouts, sweet potatoes, turnips) fruit (apricots, mangoes, oranges), legumes, and whole grains (barley, oats, and oat bran).  As you increase your fiber intake gradually, also increase the amount of water  you drink. This will help prevent constipation.  If you have difficulty achieving this goal, ask your RDN about fiber laxatives. Choose fiber supplements made with viscous fibers such as psyllium seed husks or methylcellulose to help lower unhealthy cholesterol.  Limit refined carbohydrates  There are three types of carbohydrates: starches, sugar, and fiber. Some carbohydrates occur naturally in food, like the starches in rice or corn or the sugars in fruits and milk. Refined carbohydrates--foods with high amounts of simple sugars--can raise triglyceride levels. High triglyceride levels are associated with coronary heart disease. Some examples of refined carbohydrate foods are table sugar, sweets, and beverages sweetened with added sugar. Tips for Reducing Sodium (Salt) Although sodium is important for your body to function, too much sodium can be harmful for people with high blood pressure. As sodium and fluid buildup in your tissues and bloodstream, your blood pressure increases. High blood pressure may cause damage to other organs and increase your risk for a stroke. Even if you take a pill for blood pressure or a water  pill (diuretic) to remove fluid, it is still important to have less salt in your diet. Ask your doctor and RDN what amount of sodium is right for you. Avoid processed foods. Eat more fresh foods.  Fresh fruits and vegetables are naturally low in sodium, as well as frozen  vegetables and fruits that have no added juices or sauces.  Fresh meats are lower in sodium than processed meats, such as bacon, sausage, and hotdogs. Read the nutrition label or ask your butcher to help you find a fresh meat that is low in sodium. Eat less salt--at the table and when cooking.  A single teaspoon of table salt has 2,300 mg of sodium.  Leave the salt out of recipes for pasta, casseroles, and soups.  Ask your RDN how to cook your favorite recipes without sodium Be a smart shopper.  Look for food packages that say salt-free or sodium-free. These items contain less than 5 milligrams of sodium per serving.  Very low-sodium products contain less than 35 milligrams of sodium per serving.  Low-sodium products contain less than 140 milligrams of sodium per serving.  Beware for Unsalted or No Added Salt products. These items may still be high in sodium. Check the nutrition label. Add flavors to your food without adding sodium.  Try lemon juice, lime juice, fruit juice or vinegar.  Dry or fresh herbs add flavor. Try basil, bay leaf, dill, rosemary, parsley, sage, dry mustard, nutmeg, thyme, and paprika.  Pepper, red pepper flakes, and cayenne pepper can add spice t your meals without adding sodium. Hot sauce contains sodium, but if you use just a drop or two, it will not add up  to much.  Buy a sodium-free seasoning blend or make your own at home. Additional Lifestyle Tips Achieve and maintain a healthy weight. Talk with your RDN or your doctor about what is a healthy weight for you. Set goals to reach and maintain that weight.  To lose weight, reduce your calorie intake along with increasing your physical activity. A weight loss of 10 to 15 pounds could reduce LDL-cholesterol by 5 milligrams per deciliter. Participate in physical activity. Talk with your health care team to find out what types of physical activity are best for you. Set a plan to get about 30 minutes of exercise  on most days.  Foods Recommended Food Group Foods Recommended  Grains Whole grain breads and cereals, including whole wheat, barley, rye, buckwheat, corn, teff, quinoa, millet, amaranth, brown or wild rice, sorghum, and oats Pasta, especially whole wheat or other whole grain types  [employer] [place] [employer], quinoa or wild rice Whole grain crackers, bread, rolls, pitas Home-made bread with reduced-sodium baking soda  Protein Foods Lean cuts of beef and pork (loin, leg, round, extra lean hamburger)  Skinless [employer] Photographer and other wild game Dried beans and peas Nuts and nut butters Meat alternatives made with soy or textured vegetable protein  Egg whites or egg substitute Cold cuts made with lean meat or soy protein  Dairy Nonfat (skim), low-fat, or 1%-fat milk  Nonfat or low-fat yogurt or cottage cheese Fat-free and low-fat cheese  Vegetables Fresh, frozen, or canned vegetables without added fat or salt   Fruits Fresh, frozen, canned, or dried fruit   Oils Unsaturated oils (corn, olive, peanut, soy, sunflower, canola)  Soft or liquid margarines and vegetable oil spreads  Salad dressings Seeds and nuts  Avocado   Foods Not Recommended Food Group Foods Not Recommended  Grains Breads or crackers topped with salt Cereals (hot or cold) with more than 300 mg sodium per serving Biscuits, cornbread, and other quick breads prepared with baking soda Bread crumbs or stuffing mix from a store High-fat bakery products, such as doughnuts, biscuits, croissants, danish pastries, pies, cookies Instant cooking foods to which you add hot water  and stir--potatoes, noodles, rice, etc. Packaged starchy foods--seasoned noodle or rice dishes, stuffing mix, macaroni and cheese dinner Snacks made with partially hydrogenated oils, including chips, cheese puffs, snack mixes, regular crackers, butter-flavored popcorn  Protein Foods Higher-fat cuts of meats (ribs, t-bone steak, regular hamburger) Bacon,  sausage, or hot dogs Cold cuts, such as salami or bologna, deli meats, cured meats, corned beef Organ meats (liver, brains, gizzards, sweetbreads) Poultry with skin Fried or smoked meat, poultry, and fish Whole eggs and egg yolks (more than 2-4 per week) Salted legumes, nuts, seeds, or nut/seed butters Meat alternatives with high levels of sodium (>300 mg per serving) or saturated fat (>5 g per serving)  Dairy Whole milk,?2% fat milk, buttermilk Whole milk yogurt or ice cream Cream Half-&-half Cream cheese Sour cream Cheese  Vegetables Canned or frozen vegetables with salt, fresh vegetables prepared with salt, butter, cheese, or cream sauce Fried vegetables Pickled vegetables such as olives, pickles, or sauerkraut  Fruits Fried fruits Fruits served with butter or cream  Oils Butter, stick margarine, shortening Partially hydrogenated oils or trans fats Tropical oils (coconut, palm, palm kernel oils)  Other Candy, sugar sweetened soft drinks and desserts Salt, sea salt, garlic salt, and seasoning mixes containing salt Bouillon cubes Ketchup, barbecue sauce, Worcestershire sauce, soy sauce, teriyaki sauce Miso Salsa Pickles, olives, relish   Heart Healthy Consistent Carbohydrate  Vegetarian (Lacto-Ovo) Sample 1-Day Menu  Breakfast 1 cup oatmeal, cooked (2 carbohydrate servings)   cup blueberries (1 carbohydrate serving)  11 almonds, without salt  1 cup 1% milk (1 carbohydrate serving)  1 cup coffee  Morning Snack 1 cup fat-free plain yogurt (1 carbohydrate serving)  Lunch 1 whole wheat bun (1 carbohydrate servings)  1 black bean burger (1 carbohydrate servings)  1 slice cheddar cheese, low sodium  2 slices tomatoes  2 leaves lettuce  1 teaspoon mustard  1 small pear (1 carbohydrate servings)  1 cup green tea, unsweetened  Afternoon Snack 1/3 cup trail mix with nuts, seeds, and raisins, without salt (1 carbohydrate servinga)  Evening Meal  cup meatless chicken  2/3 cup  brown rice, cooked (2 carbohydrate servings)  1 cup broccoli, cooked (2/3 carbohydrate serving)   cup carrots, cooked (1/3 carbohydrate serving)  2 teaspoons olive oil  1 teaspoon balsamic vinegar  1 whole wheat dinner roll (1 carbohydrate serving)  1 teaspoon margarine, soft, tub  1 cup 1% milk (1 carbohydrate serving)  Evening Snack 1 extra small banana (1 carbohydrate serving)  1 tablespoon peanut butter   Heart Healthy Consistent Carbohydrate Vegan Sample 1-Day Menu  Breakfast 1 cup oatmeal, cooked (2 carbohydrate servings)   cup blueberries (1 carbohydrate serving)  11 almonds, without salt  1 cup soymilk fortified with calcium , vitamin B12, and vitamin D  1 cup coffee  Morning Snack 6 ounces soy yogurt (1 carbohydrate servings)  Lunch 1 whole wheat bun(1 carbohydrate servings)  1 black bean burger (1 carbohydrate serving)  2 slices tomatoes  2 leaves lettuce  1 teaspoon mustard  1 small pear (1 carbohydrate servings)  1 cup green tea, unsweetened  Afternoon Snack 1/3 cup trail mix with nuts, seeds, and raisins, without salt (1 carbohydrate servings)  Evening Meal  cup meatless chicken  2/3 cup brown rice, cooked (2 carbohydrate servings)  1 cup broccoli, cooked (2/3 carbohydrate serving)   cup carrots, cooked (1/3 carbohydrate serving)  2 teaspoons olive oil  1 teaspoon balsamic vinegar  1 whole wheat dinner roll (1 carbohydrate serving)  1 teaspoon margarine, soft, tub  1 cup soymilk fortified with calcium , vitamin B12, and vitamin D  Evening Snack 1 extra small banana (1 carbohydrate serving)  1 tablespoon peanut butter    Heart Healthy Consistent Carbohydrate Sample 1-Day Menu  Breakfast 1 cup cooked oatmeal (2 carbohydrate servings)  3/4 cup blueberries (1 carbohydrate serving)  1 ounce almonds  1 cup skim milk (1 carbohydrate serving)  1 cup coffee  Morning Snack 1 cup sugar-free nonfat yogurt (1 carbohydrate serving)  Lunch 2 slices whole-wheat bread  (2 carbohydrate servings)  2 ounces lean turkey breast  1 ounce low-fat Swiss cheese  1 teaspoon mustard  1 slice tomato  1 lettuce leaf  1 small pear (1 carbohydrate serving)  1 cup skim milk (1 carbohydrate serving)  Afternoon Snack 1 ounce trail mix with unsalted nuts, seeds, and raisins (1 carbohydrate serving)  Evening Meal 3 ounces salmon  2/3 cup cooked brown rice (2 carbohydrate servings)  1 teaspoon soft margarine  1 cup cooked broccoli with 1/2 cup cooked carrots (1 carbohydrate serving  Carrots, cooked, boiled, drained, without salt  1 cup lettuce  1 teaspoon olive oil with vinegar for dressing  1 small whole grain roll (1 carbohydrate serving)  1 teaspoon soft margarine  1 cup unsweetened tea  Evening Snack 1 extra-small banana (1 carbohydrate serving)  Copyright 2020  [employer]. All rights reserved.

## 2024-04-13 NOTE — Plan of Care (Signed)
" °  Problem: Fluid Volume: Goal: Ability to maintain a balanced intake and output will improve Outcome: Progressing   Problem: Metabolic: Goal: Ability to maintain appropriate glucose levels will improve Outcome: Progressing   Problem: Skin Integrity: Goal: Risk for impaired skin integrity will decrease Outcome: Progressing   Problem: Tissue Perfusion: Goal: Adequacy of tissue perfusion will improve Outcome: Progressing   Problem: Education: Goal: Knowledge of General Education information will improve Description: Including pain rating scale, medication(s)/side effects and non-pharmacologic comfort measures Outcome: Progressing   Problem: Health Behavior/Discharge Planning: Goal: Ability to manage health-related needs will improve Outcome: Progressing   "

## 2024-04-13 NOTE — Progress Notes (Signed)
 "  Rounding Note   Patient Name: Tracy Sanders Date of Encounter: 04/13/2024  [facility] Cardiologist: Georganna Archer, MD   Subjective - No acute events overnight - Patient underwent successful PCI to RCA yesterday without complication. - She shares that she has some very mild residual chest discomfort, but overall feels much better.  Scheduled Meds:  aspirin  EC  81 mg Oral Daily   atorvastatin   80 mg Oral Daily   empagliflozin   10 mg Oral Daily   ezetimibe   10 mg Oral Daily   insulin  aspart  0-15 Units Subcutaneous TID WC   insulin  aspart  0-5 Units Subcutaneous QHS   insulin  aspart  3 Units Subcutaneous TID WC   insulin  glargine-yfgn  20 Units Subcutaneous QHS   levothyroxine   50 mcg Oral q morning   losartan   25 mg Oral Daily   metoprolol  succinate  25 mg Oral Daily   nicotine   14 mg Transdermal Daily   sodium chloride  flush  3 mL Intravenous Q12H   spironolactone   12.5 mg Oral Daily   ticagrelor   90 mg Oral BID   Continuous Infusions:  sodium chloride      PRN Meds: sodium chloride , acetaminophen  **OR** acetaminophen , ondansetron  **OR** ondansetron  (ZOFRAN ) IV, sodium chloride  flush   Vital Signs  Vitals:   04/12/24 1900 04/13/24 0000 04/13/24 0303 04/13/24 0409  BP: (!) 149/78 136/88 108/88   Pulse: 98 (!) 101 93   Resp: 20 18 19    Temp: 98 F (36.7 C) 98.2 F (36.8 C) 97.9 F (36.6 C)   TempSrc: Oral Oral Oral   SpO2: 96% 94% 92%   Weight:    73.3 kg  Height:        Intake/Output Summary (Last 24 hours) at 04/13/2024 0729 Last data filed at 04/12/2024 2024 Gross per 24 hour  Intake 3427 ml  Output --  Net 3427 ml      04/13/2024    4:09 AM 04/12/2024    3:30 AM 04/11/2024    3:20 PM  Last 3 Weights  Weight (lbs) 161 lb 9.6 oz 159 lb 11.2 oz 159 lb 13.3 oz  Weight (kg) 73.3 kg 72.439 kg 72.5 kg      Telemetry NSR- Personally Reviewed  ECG  NSR, T wave inversions in the lateral precordial leads -personally reviewed  Physical Exam  GEN:  No acute distress.   Neck: No JVD Cardiac: RRR, I/VI systolic murmur at LLSB, no rubs or gallops Respiratory: Clear to auscultation bilaterally. GI: Soft, nontender, non-distended  MS: No edema; R wrist with overlying bandage that appears c/d/i Neuro:  Nonfocal  Psych: Normal affect   Labs High Sensitivity Troponin:  No results for input(s): TROPONINIHS in the last 720 hours.  Recent Labs  Lab 03/16/24 1611 04/10/24 1827 04/10/24 2000 04/11/24 0127 04/11/24 1012  TRNPT 69* 96* 96* 131* 172*       Chemistry Recent Labs  Lab 04/10/24 1827 04/11/24 0132 04/11/24 1012 04/12/24 0644 04/13/24 0203  NA 137  --  138 137 139  K 4.5  --  3.9 4.2 3.6  CL 101  --  100 97* 103  CO2 22  --  24 27 22   GLUCOSE 312*  --  265* 207* 146*  BUN 20  --  14 22* 21*  CREATININE 0.91   < > 0.63 0.68 0.71  CALCIUM  9.6  --  9.6 10.3 8.9  MG  --   --  1.9  --   --  PROT 6.9  --  7.6 7.7  --   ALBUMIN 3.9  --  4.4 4.4  --   AST 38  --  33 33  --   ALT 27  --  28 34  --   ALKPHOS 64  --  68 73  --   BILITOT 0.2  --  0.4 0.6  --   GFRNONAA >60   < > >60 >60 >60  ANIONGAP 13  --  15 13 14    < > = values in this interval not displayed.    Lipids  Recent Labs  Lab 04/12/24 0644  CHOL 307*  TRIG 300*  HDL 47  LDLCALC 200*  CHOLHDL 6.6    Hematology Recent Labs  Lab 04/11/24 1012 04/12/24 0644 04/13/24 0203  WBC 8.1 8.6 8.4  RBC 5.27* 5.39* 5.10  HGB 14.8 14.8 14.0  HCT 43.4 44.6 42.6  MCV 82.4 82.7 83.5  MCH 28.1 27.5 27.5  MCHC 34.1 33.2 32.9  RDW 14.0 14.2 14.1  PLT 192 191 181   Thyroid  Recent Labs  Lab 04/11/24 1012  TSH 4.580*    BNP Recent Labs  Lab 04/10/24 2000  PROBNP 959.0*    DDimer  Recent Labs  Lab 04/10/24 1827  DDIMER 0.41     Radiology    Cardiac Studies  TTE 04/11/24:  IMPRESSIONS     1. Normal inferior and inferolateral wall motion, otherwise global  hypokinesis, concerning for LAD infarct or multivessel disease, not  classic  for takotsubo. Swirling of contrast in the apex with likely small  thrombus noted. Left ventricular ejection  fraction, by estimation, is 30 to 35%. The left ventricle has moderately  decreased function. The left ventricle demonstrates regional wall motion  abnormalities (see scoring diagram/findings for description). There is  mild concentric left ventricular  hypertrophy. Indeterminate diastolic filling due to E-A fusion.   2. Right ventricular systolic function is normal. The right ventricular  size is normal.   3. The mitral valve is degenerative. Trivial mitral valve regurgitation.  No evidence of mitral stenosis.   4. The aortic valve is tricuspid. Aortic valve regurgitation is not  visualized. Aortic valve sclerosis is present, with no evidence of aortic  valve stenosis.   5. The inferior vena cava is normal in size with greater than 50%  respiratory variability, suggesting right atrial pressure of 3 mmHg.   Patient Profile   Tracy Sanders is a 59 y/o F with a PMH of HTN, HLD, poorly controlled DM2, hypothyroidism obesity, and tobacco use disorder who presented with a chief complaint of chest pain.  Cardiology is consulted for the evaluation of NSTEMI and CHF.   Assessment & Plan   #Type I NSTEMI #Multivessel CAD - Patient presented with chest pain and found to have an elevated and rising troponin trend with associated ischemic changes on EKG consistent with a picture of ACS. - The patient has multiple risk factors for CAD and she is at very high risk. - She underwent LHC on 04/12/2024 revealed a severe stenosis of the RCA which was successfully stented. -She does have proximal LAD disease as well along with other nonobstructive CAD will be medically managed. Continue aspirin  81 mg daily Transition ticagrelor  to Plavix  anticipating starting Eliquis  (pharmacy initiating) Plan to do triple therapy with aspirin , Plavix , and Eliquis  x7 days followed by Plavix  and Eliquis  x3  months with repeat echocardiogram to assess for resolution of LV thrombus as an outpatient. Aggressive secondary prevention  Will refer to cardiac rehab at discharge    #New Acute HFrEF Exacerbation - TTE demonstrated a newly reduced LVEF to 30 to 35% with wall motion concerning for ischemic disease. -Based on the fact that the patient is having an NSTEMI I do suspect that this is likely an ischemic cardiomyopathy; however, she was recently diagnosed with the flu and thus a nonischemic cardiomyopathy has to be entertained. - She will undergo an LHC/RHC tomorrow to answer this question. - She will need GDMT moving forward and will add it gradually. Continue losartan  25 mg daily  Start metoprolol  succinate 25 mg daily Start spironolactone  12.5 mg daily Continue Jardiance  10 mg daily Would benefit from outpatient GLP-1 if affordable  #LV Apical Thrombus - Noted to have an LV apical thrombus on TTE. -Initially managed with a heparin  drip but will transition to DOAC. Start Eliquis  per pharmacy Repeat TTE in 3 months   #HLD -LDL is markedly elevated. -The patient requires aggressive lipid-lowering therapy. Continue atorvastatin  80 mg daily Continue Zetia  10 mg daily Lipoprotein a pending   #HTN - Treatment of HTN by way of treatment of CHF   #Bilateral CAS - Head and neck CT demonstrated significant bilateral carotid disease. - No symptoms currently. -She will need aggressive secondary prevention. Continue aspirin  81 mg daily Continue atorvastatin  80 mg daily Continue Zetia   #Tobacco Use Disorder Start nicotine  patch        For questions or updates, please contact [facility] Please consult [email or web address] for contact info under       Signed, Georganna Archer, MD  04/13/2024, 7:29 AM    "

## 2024-04-13 NOTE — Inpatient Diabetes Management (Signed)
 Inpatient Diabetes Program Recommendations  AACE/ADA: New Consensus Statement on Inpatient Glycemic Control (2015)  Target Ranges:  Prepandial:   less than 140 mg/dL      Peak postprandial:   less than 180 mg/dL (1-2 hours)      Critically ill patients:  140 - 180 mg/dL   Lab Results  Component Value Date   GLUCAP 302 (H) 04/13/2024   HGBA1C 8.2 (H) 04/11/2024    Review of Glycemic Control  Latest Reference Range & Units 04/12/24 13:36 04/12/24 17:13 04/12/24 22:02 04/13/24 06:31 04/13/24 11:53  Glucose-Capillary 70 - 99 mg/dL 836 (H) 823 (H) 838 (H) 169 (H) 302 (H)   Diabetes history: DM  Outpatient Diabetes medications:  Invokana  100 mg daily Lantus  22 units daily Metformin -(unsure if taking) Current orders for Inpatient glycemic control:  Novolog  0-15 units tid with meals and HS Lantus  20 units q HS Novolog  3 units tid with meals   Inpatient Diabetes Program Recommendations:    Spoke to patient at bedside.  She states she would like to call daughter while I am in the room.  Spoke to daughter and patient regarding current A1C of 8.2%.  Daughter states this is very good for her (it used to be 12%).  We briefly discussed glucose goals and I congratulated patient and daughter on A1C.  Daughter states she is moving in with patient to help her more.  Will order LWWD booklet for patient's daughter as well.  No further needs noted at this time.   Thanks,  Randall Bullocks, RN, BC-ADM Inpatient Diabetes Coordinator Pager [phone]  (8a-5p)

## 2024-04-13 NOTE — Progress Notes (Signed)
 Nutrition Education Note  RD consulted for nutrition education regarding CHF and diabetes.  Lab Results  Component Value Date   HGBA1C 8.2 (H) 04/11/2024     RD attached Heart Healthy, Consistent Carbohydrate Nutrition Therapy handout from the [employer] to patient's AVS for daughter to review. Reviewed patient's dietary recall with daughter's help via phone. State patient eats coffee and danish w/ a banana for breakfast usually, soup and sandwich for lunch, and protein dish (usually chicken) with potatoes and picked salad for supper. Drinks coffee or water  with most meals.   Provided examples on ways to decrease sodium intake in diet. Discouraged intake of processed foods and use of salt shaker. Encouraged fresh fruits and vegetables as well as whole grain sources of carbohydrates to maximize fiber intake.   RD discussed why it is important for patient to adhere to diet recommendations, and emphasized the role of fluids, foods to avoid, and importance of weighing self daily.   Discussed different food groups and their effects on blood sugar, emphasizing carbohydrate-containing foods. Provided list of carbohydrates and recommended serving sizes of common foods.  Discussed importance of controlled and consistent carbohydrate intake throughout the day. Provided examples of ways to balance meals/snacks and encouraged intake of high-fiber, whole grain complex carbohydrates.   Expect questionable compliance.  Body mass index is 33.77 kg/m. Pt meets criteria for obesity, class I based on current BMI. Of note, fluid status may be skewing weight classification somewhat. MD notes she is now euvolemic post diuresis.   Current diet order is carb modified, patient is consuming approximately 75-100% of meals at this time. Labs and medications reviewed. No further nutrition interventions warranted at this time. RD contact information provided. If additional nutrition issues arise,  please re-consult RD.   Blair Deaner MS, RD, LDN Registered Dietitian I Clinical Nutrition RD Inpatient Contact Info in Amion

## 2024-04-13 NOTE — Progress Notes (Signed)
 CARDIAC REHAB PHASE I   PRE:  Rate/Rhythm: 100 ST    BP: sitting 110/70    SpO2: 97 RA  MODE:  Ambulation: 170 ft   POST:  Rate/Rhythm: 117 ST    BP: sitting 121/88     SpO2: 97 RA   Pt tolerated fair, HR elevated, some SOB. She sts her CP is better but does seem to endorse a mild CP. To BR then return to bed. She will need education later with an interpreter. Left MI materials. She sts she wants a cigarette.  [id number]  Aliene Aris BS, ACSM-CEP 04/13/2024 11:14 AM

## 2024-04-13 NOTE — Plan of Care (Signed)
 " Problem: Education: Goal: Ability to describe self-care measures that may prevent or decrease complications (Diabetes Survival Skills Education) will improve 04/13/2024 0300 by Baldwin Doran BIRCH, RN Outcome: Not Progressing 04/13/2024 0244 by Baldwin Doran BIRCH, RN Outcome: Not Progressing Goal: Individualized Educational Video(s) 04/13/2024 0300 by Baldwin Doran BIRCH, RN Outcome: Not Progressing 04/13/2024 0244 by Baldwin Doran BIRCH, RN Outcome: Not Progressing   Problem: Coping: Goal: Ability to adjust to condition or change in health will improve 04/13/2024 0300 by Baldwin Doran BIRCH, RN Outcome: Not Progressing 04/13/2024 0244 by Baldwin Doran BIRCH, RN Outcome: Not Progressing   Problem: Fluid Volume: Goal: Ability to maintain a balanced intake and output will improve 04/13/2024 0300 by Baldwin Doran BIRCH, RN Outcome: Not Progressing 04/13/2024 0244 by Baldwin Doran BIRCH, RN Outcome: Not Progressing   Problem: Health Behavior/Discharge Planning: Goal: Ability to identify and utilize available resources and services will improve 04/13/2024 0300 by Baldwin Doran BIRCH, RN Outcome: Not Progressing 04/13/2024 0244 by Baldwin Doran BIRCH, RN Outcome: Not Progressing Goal: Ability to manage health-related needs will improve 04/13/2024 0300 by Baldwin Doran BIRCH, RN Outcome: Not Progressing 04/13/2024 0244 by Baldwin Doran BIRCH, RN Outcome: Not Progressing   Problem: Metabolic: Goal: Ability to maintain appropriate glucose levels will improve 04/13/2024 0300 by Baldwin Doran BIRCH, RN Outcome: Not Progressing 04/13/2024 0244 by Baldwin Doran BIRCH, RN Outcome: Not Progressing   Problem: Nutritional: Goal: Maintenance of adequate nutrition will improve 04/13/2024 0300 by Baldwin Doran BIRCH, RN Outcome: Not Progressing 04/13/2024 0244 by Baldwin Doran BIRCH, RN Outcome: Not Progressing Goal: Progress toward achieving an optimal weight will improve 04/13/2024 0300 by Baldwin Doran BIRCH, RN Outcome: Not  Progressing 04/13/2024 0244 by Baldwin Doran BIRCH, RN Outcome: Not Progressing   Problem: Skin Integrity: Goal: Risk for impaired skin integrity will decrease 04/13/2024 0300 by Baldwin Doran BIRCH, RN Outcome: Not Progressing 04/13/2024 0244 by Baldwin Doran BIRCH, RN Outcome: Not Progressing   Problem: Tissue Perfusion: Goal: Adequacy of tissue perfusion will improve 04/13/2024 0300 by Baldwin Doran BIRCH, RN Outcome: Not Progressing 04/13/2024 0244 by Baldwin Doran BIRCH, RN Outcome: Not Progressing   Problem: Education: Goal: Knowledge of General Education information will improve Description: Including pain rating scale, medication(s)/side effects and non-pharmacologic comfort measures 04/13/2024 0300 by Baldwin Doran BIRCH, RN Outcome: Not Progressing 04/13/2024 0244 by Baldwin Doran BIRCH, RN Outcome: Not Progressing   Problem: Health Behavior/Discharge Planning: Goal: Ability to manage health-related needs will improve 04/13/2024 0300 by Baldwin Doran BIRCH, RN Outcome: Not Progressing 04/13/2024 0244 by Baldwin Doran BIRCH, RN Outcome: Not Progressing   Problem: Clinical Measurements: Goal: Ability to maintain clinical measurements within normal limits will improve 04/13/2024 0300 by Baldwin Doran BIRCH, RN Outcome: Not Progressing 04/13/2024 0244 by Baldwin Doran BIRCH, RN Outcome: Not Progressing Goal: Will remain free from infection 04/13/2024 0300 by Baldwin Doran BIRCH, RN Outcome: Not Progressing 04/13/2024 0244 by Baldwin Doran BIRCH, RN Outcome: Not Progressing Goal: Diagnostic test results will improve 04/13/2024 0300 by Baldwin Doran BIRCH, RN Outcome: Not Progressing 04/13/2024 0244 by Baldwin Doran BIRCH, RN Outcome: Not Progressing Goal: Respiratory complications will improve 04/13/2024 0300 by Baldwin Doran BIRCH, RN Outcome: Not Progressing 04/13/2024 0244 by Baldwin Doran BIRCH, RN Outcome: Not Progressing Goal: Cardiovascular complication will be avoided 04/13/2024 0300 by Baldwin Doran BIRCH, RN Outcome: Not Progressing 04/13/2024 0244 by Baldwin Doran BIRCH, RN Outcome: Not Progressing   Problem: Activity: Goal: Risk for activity intolerance will decrease 04/13/2024 0300 by Baldwin Doran BIRCH, RN Outcome: Not Progressing  04/13/2024 0244 by Baldwin Doran BIRCH, RN Outcome: Not Progressing   Problem: Nutrition: Goal: Adequate nutrition will be maintained 04/13/2024 0300 by Baldwin Doran BIRCH, RN Outcome: Not Progressing 04/13/2024 0244 by Baldwin Doran BIRCH, RN Outcome: Not Progressing   Problem: Coping: Goal: Level of anxiety will decrease 04/13/2024 0300 by Baldwin Doran BIRCH, RN Outcome: Not Progressing 04/13/2024 0244 by Baldwin Doran BIRCH, RN Outcome: Not Progressing   "

## 2024-04-14 ENCOUNTER — Other Ambulatory Visit (HOSPITAL_COMMUNITY): Payer: Self-pay

## 2024-04-14 LAB — LIPOPROTEIN A (LPA): Lipoprotein (a): 13.4 nmol/L

## 2024-04-14 LAB — BASIC METABOLIC PANEL WITH GFR
Anion gap: 11 (ref 5–15)
BUN: 21 mg/dL — ABNORMAL HIGH (ref 6–20)
CO2: 23 mmol/L (ref 22–32)
Calcium: 8.9 mg/dL (ref 8.9–10.3)
Chloride: 106 mmol/L (ref 98–111)
Creatinine, Ser: 0.6 mg/dL (ref 0.44–1.00)
GFR, Estimated: >60 mL/min
Glucose, Bld: 170 mg/dL — ABNORMAL HIGH (ref 70–99)
Potassium: 3.8 mmol/L (ref 3.5–5.1)
Sodium: 139 mmol/L (ref 135–145)

## 2024-04-14 LAB — GLUCOSE, CAPILLARY
Glucose-Capillary: 175 mg/dL — ABNORMAL HIGH (ref 70–99)
Glucose-Capillary: 185 mg/dL — ABNORMAL HIGH (ref 70–99)

## 2024-04-14 MED ORDER — APIXABAN 5 MG PO TABS
5.0000 mg | ORAL_TABLET | Freq: Two times a day (BID) | ORAL | 0 refills | Status: AC
  Filled 2024-04-14: qty 60, 30d supply, fill #0

## 2024-04-14 MED ORDER — LOSARTAN POTASSIUM 25 MG PO TABS
25.0000 mg | ORAL_TABLET | Freq: Every day | ORAL | 0 refills | Status: AC
  Filled 2024-04-14: qty 30, 30d supply, fill #0

## 2024-04-14 MED ORDER — EZETIMIBE 10 MG PO TABS
10.0000 mg | ORAL_TABLET | Freq: Every day | ORAL | 0 refills | Status: AC
  Filled 2024-04-14: qty 30, 30d supply, fill #0

## 2024-04-14 MED ORDER — CLOPIDOGREL BISULFATE 75 MG PO TABS
75.0000 mg | ORAL_TABLET | Freq: Every day | ORAL | 0 refills | Status: AC
  Filled 2024-04-14: qty 30, 30d supply, fill #0

## 2024-04-14 MED ORDER — LANTUS SOLOSTAR 100 UNIT/ML ~~LOC~~ SOPN
22.0000 [IU] | PEN_INJECTOR | Freq: Every day | SUBCUTANEOUS | 0 refills | Status: AC
  Filled 2024-04-14: qty 6, 27d supply, fill #0

## 2024-04-14 MED ORDER — SPIRONOLACTONE 25 MG PO TABS
25.0000 mg | ORAL_TABLET | Freq: Every day | ORAL | Status: DC
  Administered 2024-04-14: 25 mg via ORAL
  Filled 2024-04-14: qty 1

## 2024-04-14 MED ORDER — METOPROLOL SUCCINATE ER 25 MG PO TB24
25.0000 mg | ORAL_TABLET | Freq: Every day | ORAL | 0 refills | Status: AC
  Filled 2024-04-14: qty 30, 30d supply, fill #0

## 2024-04-14 MED ORDER — ASPIRIN 81 MG PO TBEC
81.0000 mg | DELAYED_RELEASE_TABLET | Freq: Every day | ORAL | 0 refills | Status: AC
  Filled 2024-04-14: qty 4, 4d supply, fill #0

## 2024-04-14 MED ORDER — INSULIN GLARGINE-YFGN 100 UNIT/ML ~~LOC~~ SOLN
24.0000 [IU] | Freq: Every day | SUBCUTANEOUS | Status: DC
  Filled 2024-04-14: qty 0.24

## 2024-04-14 MED ORDER — EMPAGLIFLOZIN 10 MG PO TABS
10.0000 mg | ORAL_TABLET | Freq: Every day | ORAL | 0 refills | Status: AC
  Filled 2024-04-14: qty 30, 30d supply, fill #0

## 2024-04-14 MED ORDER — INSULIN ASPART 100 UNIT/ML IJ SOLN: 4.0000 [IU] | Freq: Three times a day (TID) | INTRAMUSCULAR | Status: DC

## 2024-04-14 MED ORDER — SPIRONOLACTONE 25 MG PO TABS
25.0000 mg | ORAL_TABLET | Freq: Every day | ORAL | 0 refills | Status: AC
  Filled 2024-04-14: qty 30, 30d supply, fill #0

## 2024-04-14 MED ORDER — INSULIN PEN NEEDLE 32G X 4 MM MISC
0 refills | Status: AC
  Filled 2024-04-14: qty 100, 30d supply, fill #0

## 2024-04-14 NOTE — Inpatient Diabetes Management (Signed)
 Inpatient Diabetes Program Recommendations  AACE/ADA: New Consensus Statement on Inpatient Glycemic Control (2015)  Target Ranges:  Prepandial:   less than 140 mg/dL      Peak postprandial:   less than 180 mg/dL (1-2 hours)      Critically ill patients:  140 - 180 mg/dL   Lab Results  Component Value Date   GLUCAP 175 (H) 04/14/2024   HGBA1C 8.2 (H) 04/11/2024    Review of Glycemic Control  Latest Reference Range & Units 04/13/24 06:31 04/13/24 11:53 04/13/24 17:12 04/13/24 21:18 04/14/24 06:16  Glucose-Capillary 70 - 99 mg/dL 830 (H) 697 (H) 763 (H) 186 (H) 175 (H)   Diabetes history: DM 2 Outpatient Diabetes medications:  Invokana  100 mg daily Lantus  22 units daily Metformin -(unsure if taking) Current orders for Inpatient glycemic control:  Novolog  0-15 units tid with meals and HS Lantus  20 units q HS Novolog  3 units tid with meals   Inpatient Diabetes Program Recommendations:    Consider increasing Lantus  to 24 units daily and also consider increasing Novolog  to 4 units tid with meals (hold if patient eats less than 50% or NPO).   Thanks,  Randall Bullocks, RN, BC-ADM Inpatient Diabetes Coordinator Pager [phone]  (8a-5p)

## 2024-04-14 NOTE — Plan of Care (Signed)
   Problem: Coping: Goal: Ability to adjust to condition or change in health will improve Outcome: Progressing   Problem: Fluid Volume: Goal: Ability to maintain a balanced intake and output will improve Outcome: Progressing   Problem: Metabolic: Goal: Ability to maintain appropriate glucose levels will improve Outcome: Progressing   Problem: Nutritional: Goal: Maintenance of adequate nutrition will improve Outcome: Progressing

## 2024-04-14 NOTE — Progress Notes (Signed)
 CARDIAC REHAB PHASE I    Post MI/stent education including restrictions, risk factors, exercise guidelines, antiplatelet therapy importance, NTG use, heart healthy diabetic diet, smoking cessation and CRP2 reviewed with patients daughter. Extra time spent discussing tobacco cessation/methods for her mother and reached out to SW for financial concerns. All questions and concerns addressed. Will refer to [facility] for CRP2.    0830-0900 Tracy JAYSON [place], RN BSN 04/14/2024 9:00 AM

## 2024-04-14 NOTE — Progress Notes (Signed)
 "  Rounding Note   Patient Name: Tracy Sanders Date of Encounter: 04/14/2024  [facility] Cardiologist: Georganna Archer, MD   Subjective - No acute events overnight - Patient states that she is now chest pain-free - No complaints today  Scheduled Meds:  apixaban   5 mg Oral BID   aspirin  EC  81 mg Oral Daily   atorvastatin   80 mg Oral Daily   clopidogrel   75 mg Oral Daily   empagliflozin   10 mg Oral Daily   ezetimibe   10 mg Oral Daily   insulin  aspart  0-15 Units Subcutaneous TID WC   insulin  aspart  0-5 Units Subcutaneous QHS   insulin  aspart  3 Units Subcutaneous TID WC   insulin  glargine-yfgn  20 Units Subcutaneous QHS   levothyroxine   50 mcg Oral q morning   losartan   25 mg Oral Daily   metoprolol  succinate  25 mg Oral Daily   nicotine   14 mg Transdermal Daily   nicotine   14 mg Transdermal Daily   sodium chloride  flush  3 mL Intravenous Q12H   spironolactone   25 mg Oral Daily   Continuous Infusions:  PRN Meds: acetaminophen  **OR** acetaminophen , ondansetron  **OR** ondansetron  (ZOFRAN ) IV, sodium chloride  flush   Vital Signs  Vitals:   04/13/24 1922 04/13/24 2250 04/14/24 0402 04/14/24 0600  BP: (!) 149/92 119/80 123/78   Pulse: 97 98 92   Resp: 17 20 17    Temp: 98.3 F (36.8 C) 97.9 F (36.6 C) 98 F (36.7 C)   TempSrc: Oral Oral Oral   SpO2: 98% 97% 95%   Weight:    73.4 kg  Height:        Intake/Output Summary (Last 24 hours) at 04/14/2024 0729 Last data filed at 04/13/2024 1700 Gross per 24 hour  Intake 480 ml  Output --  Net 480 ml      04/14/2024    6:00 AM 04/13/2024    4:09 AM 04/12/2024    3:30 AM  Last 3 Weights  Weight (lbs) 161 lb 13.1 oz 161 lb 9.6 oz 159 lb 11.2 oz  Weight (kg) 73.4 kg 73.3 kg 72.439 kg      Telemetry NSR- Personally Reviewed  ECG  No new ECG  Physical Exam  GEN: No acute distress.   Neck: No JVD Cardiac: RRR, I/VI systolic murmur at LLSB, no rubs or gallops Respiratory: Clear to auscultation  bilaterally. GI: Soft, nontender, non-distended  MS: No edema; no deformity Neuro:  Nonfocal  Psych: Normal affect   Labs High Sensitivity Troponin:  No results for input(s): TROPONINIHS in the last 720 hours.  Recent Labs  Lab 03/16/24 1611 04/10/24 1827 04/10/24 2000 04/11/24 0127 04/11/24 1012  TRNPT 69* 96* 96* 131* 172*       Chemistry Recent Labs  Lab 04/10/24 1827 04/11/24 0132 04/11/24 1012 04/12/24 0644 04/12/24 1153 04/12/24 1212 04/13/24 0203 04/14/24 0158  NA 137  --  138 137   < > 138 139 139  K 4.5  --  3.9 4.2   < > 3.6 3.6 3.8  CL 101  --  100 97*  --   --  103 106  CO2 22  --  24 27  --   --  22 23  GLUCOSE 312*  --  265* 207*  --   --  146* 170*  BUN 20  --  14 22*  --   --  21* 21*  CREATININE 0.91   < > 0.63 0.68  --   --  0.71 0.60  CALCIUM  9.6  --  9.6 10.3  --   --  8.9 8.9  MG  --   --  1.9  --   --   --   --   --   PROT 6.9  --  7.6 7.7  --   --   --   --   ALBUMIN 3.9  --  4.4 4.4  --   --   --   --   AST 38  --  33 33  --   --   --   --   ALT 27  --  28 34  --   --   --   --   ALKPHOS 64  --  68 73  --   --   --   --   BILITOT 0.2  --  0.4 0.6  --   --   --   --   GFRNONAA >60   < > >60 >60  --   --  >60 >60  ANIONGAP 13  --  15 13  --   --  14 11   < > = values in this interval not displayed.    Lipids  Recent Labs  Lab 04/12/24 0644  CHOL 307*  TRIG 300*  HDL 47  LDLCALC 200*  CHOLHDL 6.6    Hematology Recent Labs  Lab 04/11/24 1012 04/12/24 0644 04/12/24 1153 04/12/24 1212 04/13/24 0203  WBC 8.1 8.6  --   --  8.4  RBC 5.27* 5.39*  --   --  5.10  HGB 14.8 14.8 15.0 13.9 14.0  HCT 43.4 44.6 44.0 41.0 42.6  MCV 82.4 82.7  --   --  83.5  MCH 28.1 27.5  --   --  27.5  MCHC 34.1 33.2  --   --  32.9  RDW 14.0 14.2  --   --  14.1  PLT 192 191  --   --  181   Thyroid  Recent Labs  Lab 04/11/24 1012  TSH 4.580*    BNP Recent Labs  Lab 04/10/24 2000  PROBNP 959.0*    DDimer  Recent Labs  Lab 04/10/24 1827   DDIMER 0.41     Radiology  CARDIAC CATHETERIZATION Result Date: 04/12/2024   1st Mrg lesion is 70% stenosed.   2nd Mrg lesion is 50% stenosed. 1.  Critical distal RCA stenosis of 99%, treated successfully with PCI using a 3.0 x 20 mm Synergy DES 2.  Patent left main with no significant stenosis 3.  Moderate to severe proximal LAD stenosis of 70%, calcified lesion, with diffuse nonobstructive mid/distal LAD plaquing 4.  Patent circumflex with diffusely diseased distal vessels in a pattern of diabetic vasculopathy 5.  Low intracardiac filling pressures with LVEDP less than 5 mmHg and low/normal right heart pressures throughout Recommendations: Aggressive medical therapy, tobacco cessation, LDL lowering less than 55 mg/dL.  I think her residual CAD can be managed medically unless she develops recurrent symptoms of angina. Continue cangrelor  x 2 hours, DAPT with ASA and ticagrelor  x 12 months. Ticagrelor  loaded in the cath lab.    Cardiac Studies  LHC/RHC 04/12/24:  1.  Critical distal RCA stenosis of 99%, treated successfully with PCI using a 3.0 x 20 mm Synergy DES 2.  Patent left main with no significant stenosis 3.  Moderate to severe proximal LAD stenosis of 70%, calcified lesion, with diffuse nonobstructive mid/distal LAD plaquing 4.  Patent circumflex with diffusely diseased  distal vessels in a pattern of diabetic vasculopathy 5.  Low intracardiac filling pressures with LVEDP less than 5 mmHg and low/normal right heart pressures throughout  TTE 04/11/24:   IMPRESSIONS     1. Normal inferior and inferolateral wall motion, otherwise global  hypokinesis, concerning for LAD infarct or multivessel disease, not  classic for takotsubo. Swirling of contrast in the apex with likely small  thrombus noted. Left ventricular ejection  fraction, by estimation, is 30 to 35%. The left ventricle has moderately  decreased function. The left ventricle demonstrates regional wall motion  abnormalities (see  scoring diagram/findings for description). There is  mild concentric left ventricular  hypertrophy. Indeterminate diastolic filling due to E-A fusion.   2. Right ventricular systolic function is normal. The right ventricular  size is normal.   3. The mitral valve is degenerative. Trivial mitral valve regurgitation.  No evidence of mitral stenosis.   4. The aortic valve is tricuspid. Aortic valve regurgitation is not  visualized. Aortic valve sclerosis is present, with no evidence of aortic  valve stenosis.   5. The inferior vena cava is normal in size with greater than 50%  respiratory variability, suggesting right atrial pressure of 3 mmHg.   Patient Profile   Ms. Lauralyn Shadowens is a 59 y/o F with a PMH of HTN, HLD, poorly controlled DM2, hypothyroidism obesity, and tobacco use disorder who presented with a chief complaint of chest pain. Cardiology is consulted for the evaluation of NSTEMI and CHF.   Assessment & Plan   #Type I NSTEMI #Multivessel CAD - Patient presented with chest pain and found to have an elevated and rising troponin trend with associated ischemic changes on EKG consistent with a picture of ACS. - The patient has multiple risk factors for CAD and she is at very high risk. - She underwent LHC on 04/12/2024 revealed a severe stenosis of the RCA which was successfully stented. -She does have proximal LAD disease as well along with other nonobstructive CAD will be medically managed. Continue aspirin  81 mg daily Continue Plavix  75 mg daily Continue aspirin  81 mg daily Aggressive secondary prevention Will refer to cardiac rehab at discharge     #New Acute HFrEF Exacerbation - TTE demonstrated a newly reduced LVEF to 30 to 35% with wall motion concerning for ischemic disease. -Based on the fact that the patient is having an NSTEMI I do suspect that this is likely an ischemic cardiomyopathy; however, she was recently diagnosed with the flu and thus a nonischemic  cardiomyopathy has to be entertained. - She will undergo an LHC/RHC tomorrow to answer this question. - She will need GDMT moving forward and will add it gradually. Continue losartan  25 mg daily  Continue metoprolol  succinate 25 mg daily Increase spironolactone  to 25 mg daily Continue Jardiance  10 mg daily Would benefit from outpatient GLP-1 if affordable   #LV Apical Thrombus - Noted to have an LV apical thrombus on TTE. -Initially managed with a heparin  drip but will transition to DOAC. Continue Eliquis  5 mg twice daily Repeat TTE in 3 months   #HLD -LDL is markedly elevated. -The patient requires aggressive lipid-lowering therapy. Continue atorvastatin  80 mg daily Continue Zetia  10 mg daily Lipoprotein a pending   #HTN - Treatment of HTN by way of treatment of CHF   #Bilateral CAS - Head and neck CT demonstrated significant bilateral carotid disease. - No symptoms currently. -She will need aggressive secondary prevention. Continue aspirin  81 mg daily Continue atorvastatin  80 mg daily Continue  Zetia    #Tobacco Use Disorder Start nicotine  patch    [facility] will sign off.   The patient is ready for discharge today from a cardiac standpoint. Medication Recommendations: Continue Plavix  75 mg daily, continue aspirin  81 mg daily until 04/19/24 (last dose on that day), continue Eliquis  5 mg twice daily, continue all currently prescribed GDMT for CHF, continue nicotine  patch, STOP home canagliflozin , STOP home lisinopril , STOP home naproxen  Other recommendations (labs, testing, etc):  BMP. CBC Follow up as an outpatient: Cardiology will arrange cardiology follow-up For questions or updates, please contact [facility] Please consult [email or web address] for contact info under       Signed, Georganna Archer, MD  04/14/2024, 7:29 AM    "

## 2024-04-14 NOTE — Discharge Summary (Signed)
 " Physician Discharge Summary   Patient: Tracy Sanders MRN: [id number] DOB: 1965/09/13  Admit date:     04/10/2024  Discharge date: {dischdate:26783}  Discharge Physician: Tracy Sanders   PCP: Default, Provider, MD   Recommendations at discharge:  {Tip this will not be part of the note when signed- Example include specific recommendations for outpatient follow-up, pending tests to follow-up on. (Optional):26781}  ***  Discharge Diagnoses: Principal Problem:   Acute congestive heart failure (HCC) Active Problems:   Acute HFrEF (heart failure with reduced ejection fraction) (HCC)   Non-ST elevation (NSTEMI) myocardial infarction (HCC)   Left ventricular apical thrombus  Resolved Problems:   * No resolved hospital problems. [facility] Course: No notes on file  Assessment and Plan: No notes have been filed under this hospital service. Service: Hospitalist     {Tip this will not be part of the note when signed Body mass index is 33.82 kg/m. , ,  (Optional):26781}  {(NOTE) Pain control PDMP Statment (Optional):26782} Consultants: *** Procedures performed: ***  Disposition: {Plan; Disposition:26390} Diet recommendation:  {Diet_Plan:26776} DISCHARGE MEDICATION: Allergies as of 04/14/2024       Reactions   Morphine  Other (See Comments)   Passes out   Other    a cold medicine  - not sure which one        Medication List     STOP taking these medications    canagliflozin  100 MG Tabs tablet Commonly known as: Invokana    ibuprofen  200 MG tablet Commonly known as: ADVIL    lisinopril  20 MG tablet Commonly known as: ZESTRIL    naproxen  sodium 220 MG tablet Commonly known as: ALEVE        TAKE these medications    apixaban  5 MG Tabs tablet Commonly known as: ELIQUIS  Take 1 tablet (5 mg total) by mouth 2 (two) times daily.   aspirin  EC 81 MG tablet Take 1 tablet (81 mg total) by mouth daily for 4 days. Swallow whole. Start taking on: April 15, 2024    atorvastatin  80 MG tablet Commonly known as: LIPITOR  Take 1 tablet (80 mg total) by mouth daily.   clopidogrel  75 MG tablet Commonly known as: PLAVIX  Take 1 tablet (75 mg total) by mouth daily. Start taking on: April 15, 2024   docusate sodium 100 MG capsule Commonly known as: COLACE Take 100 mg by mouth 2 (two) times daily as needed for moderate constipation.   empagliflozin  10 MG Tabs tablet Commonly known as: JARDIANCE  Take 1 tablet (10 mg total) by mouth daily. Start taking on: April 15, 2024   ezetimibe  10 MG tablet Commonly known as: ZETIA  Take 1 tablet (10 mg total) by mouth daily. Start taking on: April 15, 2024   Lantus  SoloStar 100 UNIT/ML Solostar Pen Generic drug: insulin  glargine Inject 22 Units into the skin daily.   levothyroxine  50 MCG tablet Commonly known as: SYNTHROID  Take 50 mcg by mouth every morning.   losartan  25 MG tablet Commonly known as: COZAAR  Take 1 tablet (25 mg total) by mouth daily. Start taking on: April 15, 2024   meclizine  12.5 MG tablet Commonly known as: ANTIVERT  Take 1 tablet (12.5 mg total) by mouth 2 (two) times daily as needed for dizziness.   metFORMIN  500 MG tablet Commonly known as: GLUCOPHAGE  Take 1 tablet (500 mg total) by mouth 2 (two) times daily with a meal.   metoprolol  succinate 25 MG 24 hr tablet Commonly known as: TOPROL -XL Take 1 tablet (25 mg total) by mouth daily. Start taking  on: April 15, 2024   ondansetron  4 MG tablet Commonly known as: Zofran  Take 1 tablet (4 mg total) by mouth every 8 (eight) hours as needed for nausea or vomiting.   spironolactone  25 MG tablet Commonly known as: ALDACTONE  Take 1 tablet (25 mg total) by mouth daily. Start taking on: April 15, 2024        Follow-up Information     Tracy Madelin Patch, MD Follow up on 04/19/2024.   Specialty: Family Medicine Why: 11 for hospital follow up you will see Tracy Canny NP Contact information: [address] Tracy Sanders [place] [address] [phone]         [facility]. Go in 4 day(s).   Specialty: Cardiology Why: Hospital follow up 04/22/2024 @ 9 am PLEASE bring a current medication list to appointment FREE valet parking, Entrance C, off [employer] [facility] entrance Contact information: [address] Tracy Sanders  [phone]               Discharge Exam: Tracy Sanders   04/12/24 0330 04/13/24 0409 04/14/24 0600  Weight: 72.4 kg 73.3 kg 73.4 kg   ***  Condition at discharge: {DC Condition:26389}  The results of significant diagnostics from this hospitalization (including imaging, microbiology, ancillary and laboratory) are listed below for reference.   Imaging Studies: CARDIAC CATHETERIZATION Result Date: 04/12/2024   1st Mrg lesion is 70% stenosed.   2nd Mrg lesion is 50% stenosed. 1.  Critical distal RCA stenosis of 99%, treated successfully with PCI using a 3.0 x 20 mm Synergy DES 2.  Patent left main with no significant stenosis 3.  Moderate to severe proximal LAD stenosis of 70%, calcified lesion, with diffuse nonobstructive mid/distal LAD plaquing 4.  Patent circumflex with diffusely diseased distal vessels in a pattern of diabetic vasculopathy 5.  Low intracardiac filling pressures with LVEDP less than 5 mmHg and low/normal right heart pressures throughout Recommendations: Aggressive medical therapy, tobacco cessation, LDL lowering less than 55 mg/dL.  I think her residual CAD can be managed medically unless she develops recurrent symptoms of angina. Continue cangrelor  x 2 hours, DAPT with ASA and ticagrelor  x 12 months. Ticagrelor  loaded in the cath lab.   ECHOCARDIOGRAM COMPLETE Result Date: 04/11/2024    ECHOCARDIOGRAM REPORT   Patient Name:   Tracy Sanders Date of Exam: 04/11/2024 Medical Rec #:  [id number]       Height:       58.0 in Accession #:    [id number]       Weight:       159.8 lb Date of Birth:  08-Jan-1966       BSA:          1.656 m Patient Age:    58 years        BP:           127/91 mmHg Patient Gender: F               HR:           96 bpm. Exam Location:  Inpatient Procedure: 2D Echo, Cardiac Doppler, Color Doppler and Intracardiac            Opacification Agent (Both Spectral and Color Flow Doppler were            utilized during procedure). Indications:    I50.40* Unspecified combined systolic (congestive) and diastolic                 (  congestive) heart failure  History:        Patient has no prior history of Echocardiogram examinations.                 CHF, Signs/Symptoms:Chest Pain, Shortness of Breath, Dyspnea and                 Dizziness/Lightheadedness; Risk Factors:Current Smoker,                 Diabetes, Hypertension and Dyslipidemia.  Sonographer:    Ellouise Mose RDCS Referring Phys: [id number] OLADAPO ADEFESO  Sonographer Comments: Technically difficult study due to poor echo windows. Physician in room talking with patient and family during study. IMPRESSIONS  1. Normal inferior and inferolateral wall motion, otherwise global hypokinesis, concerning for LAD infarct or multivessel disease, not classic for takotsubo. Swirling of contrast in the apex with likely small thrombus noted. Left ventricular ejection fraction, by estimation, is 30 to 35%. The left ventricle has moderately decreased function. The left ventricle demonstrates regional wall motion abnormalities (see scoring diagram/findings for description). There is mild concentric left ventricular hypertrophy. Indeterminate diastolic filling due to E-A fusion.  2. Right ventricular systolic function is normal. The right ventricular size is normal.  3. The mitral valve is degenerative. Trivial mitral valve regurgitation. No evidence of mitral stenosis.  4. The aortic valve is tricuspid. Aortic valve regurgitation is not visualized. Aortic valve sclerosis is present, with no evidence of aortic valve  stenosis.  5. The inferior vena cava is normal in size with greater than 50% respiratory variability, suggesting right atrial pressure of 3 mmHg. Comparison(s): No prior Echocardiogram. FINDINGS  Left Ventricle: Normal inferior and inferolateral wall motion, otherwise global hypokinesis, concerning for LAD infarct or multivessel disease, not classic for takotsubo. Swirling of contrast in the apex with likely small thrombus noted. Left ventricular ejection fraction, by estimation, is 30 to 35%. The left ventricle has moderately decreased function. The left ventricle demonstrates regional wall motion abnormalities. Definity  contrast agent was given IV to delineate the left ventricular endocardial borders. The left ventricular internal cavity size was normal in size. There is mild concentric left ventricular hypertrophy. Indeterminate diastolic filling due to E-A fusion. Normal left ventricular filling pressure. Right Ventricle: The right ventricular size is normal. No increase in right ventricular wall thickness. Right ventricular systolic function is normal. Left Atrium: Left atrial size was normal in size. Right Atrium: Right atrial size was normal in size. Pericardium: There is no evidence of pericardial effusion. Presence of epicardial fat layer. Mitral Valve: The mitral valve is degenerative in appearance. Trivial mitral valve regurgitation. No evidence of mitral valve stenosis. Tricuspid Valve: The tricuspid valve is normal in structure. Tricuspid valve regurgitation is not demonstrated. No evidence of tricuspid stenosis. Aortic Valve: The aortic valve is tricuspid. Aortic valve regurgitation is not visualized. Aortic valve sclerosis is present, with no evidence of aortic valve stenosis. Pulmonic Valve: The pulmonic valve was normal in structure. Pulmonic valve regurgitation is not visualized. No evidence of pulmonic stenosis. Aorta: The aortic root and ascending aorta are structurally normal, with no evidence  of dilitation. Venous: The inferior vena cava is normal in size with greater than 50% respiratory variability, suggesting right atrial pressure of 3 mmHg. IAS/Shunts: No atrial level shunt detected by color flow Doppler.  LEFT VENTRICLE PLAX 2D LVIDd:         4.85 cm      Diastology LVIDs:         4.05 cm  LV e' medial:    6.66 cm/s LV PW:         1.10 cm      LV E/e' medial:  15.6 LV IVS:        1.00 cm      LV e' lateral:   4.13 cm/s LVOT diam:     2.10 cm      LV E/e' lateral: 25.2 LV SV:         53 LV SV Index:   32 LVOT Area:     3.46 cm  LV Volumes (MOD) LV vol d, MOD A2C: 92.0 ml LV vol d, MOD A4C: 105.0 ml LV vol s, MOD A2C: 67.1 ml LV vol s, MOD A4C: 64.4 ml LV SV MOD A2C:     24.9 ml LV SV MOD A4C:     105.0 ml LV SV MOD BP:      32.6 ml RIGHT VENTRICLE             IVC RV S prime:     12.40 cm/s  IVC diam: 1.90 cm TAPSE (M-mode): 1.5 cm                             PULMONARY VEINS                             Diastolic Velocity: 30.70 cm/s                             S/D Velocity:       1.00                             Systolic Velocity:  30.20 cm/s LEFT ATRIUM             Index        RIGHT ATRIUM          Index LA diam:        2.70 cm 1.63 cm/m   RA Area:     6.67 cm LA Vol (A2C):   27.8 ml 16.79 ml/m  RA Volume:   9.56 ml  5.77 ml/m LA Vol (A4C):   35.0 ml 21.14 ml/m LA Biplane Vol: 32.7 ml 19.75 ml/m  AORTIC VALVE LVOT Vmax:   101.60 cm/s LVOT Vmean:  67.233 cm/s LVOT VTI:    0.153 m  AORTA Ao Asc diam: 3.40 cm MITRAL VALVE MV Area (PHT): 6.54 cm     SHUNTS MV Decel Time: 116 msec     Systemic VTI:  0.15 m MV E velocity: 104.00 cm/s  Systemic Diam: 2.10 cm Tracy Sanders Electronically signed by Tracy Sanders Signature Date/Time: 04/11/2024/12:00:36 PM    Final    CT ANGIO HEAD NECK W WO CM Result Date: 04/11/2024 EXAM: CTA HEAD AND NECK WITHOUT AND WITH 04/11/2024 06:21:00 AM TECHNIQUE: CTA of the head and neck was performed without and with the administration of 75 mL iohexol  (OMNIPAQUE )  350 MG/ML injection. Multiplanar 2D and/or 3D reformatted images are provided for review. Automated exposure control, iterative reconstruction, and/or weight based adjustment of the mA/kV was utilized to reduce the radiation dose to as low as reasonably achievable. Stenosis of the internal carotid arteries measured using NASCET criteria. COMPARISON: CT of the head dated 04/11/2024. CLINICAL HISTORY: Neuro deficit, acute, stroke suspected.  Acute neurologic deficit; stroke suspected. FINDINGS: CTA NECK: AORTIC ARCH AND ARCH VESSELS: No dissection or arterial injury. No significant stenosis of the brachiocephalic or subclavian arteries. CERVICAL CAROTID ARTERIES: Moderate calcific plaque present within the origin of the right internal carotid artery, with less than 50% luminal stenosis. There is calcific plaque within the left carotid bulb with less than 20% stenosis of the origin of the left internal carotid artery. No dissection or arterial injury. CERVICAL VERTEBRAL ARTERIES: The vertebral arteries are codominant and normal in caliber. There is mild calcific plaque within the V4 segments. There is less than 50% stenosis of the left distal V4 segment. No dissection or arterial injury. LUNGS AND MEDIASTINUM: Unremarkable. SOFT TISSUES: No acute abnormality. BONES: No acute abnormality. CTA HEAD: ANTERIOR CIRCULATION: Internal Carotid Arteries (ICA): Right ICA: Moderate calcific atheromatous disease within the carotid siphon. Moderate stenosis of the anterior genu and supraclinoid segments of the right internal carotid artery, approximately 60-70%. Left ICA: Moderate calcific atheromatous disease within the carotid siphon. Moderate stenosis of the supraclinoid segment of the left internal carotid artery, approximately 60%. Middle Cerebral Arteries (MCA): Right MCA: Mild irregular stenosis of the M1 segment. Left MCA: No significant stenosis. Anterior Cerebral Arteries (ACA): No significant stenosis. No aneurysm.  POSTERIOR CIRCULATION: Posterior Cerebral Arteries (PCA): Right PCA: Moderate irregular stenosis of the P1 and P2 segments. Left PCA: No significant stenosis. Basilar Artery: No significant stenosis. Vertebral Arteries: No significant stenosis of the intracranial segments. No aneurysm. OTHER: No dural venous sinus thrombosis on this non-dedicated study. IMPRESSION: 1. Moderate stenosis of the anterior genu and supraclinoid segments of the right internal carotid artery, approximately 60 to 70%. 2. Moderate stenosis of the supraclinoid segment of the left internal carotid artery, approximately 60%. 3. Mild irregular stenosis of the M1 segment of the right middle cerebral artery. 4. Moderate irregular stenosis of the P1 and P2 segments of the right posterior cerebral artery. 5. Moderate calcific plaque at the origin of the right internal carotid artery with less than 50% luminal stenosis. 6. Mild calcific plaque within the V4 segments with less than 50% stenosis of the left distal V4 segment. Electronically signed by: Evalene Coho MD 04/11/2024 07:20 AM EST RP Workstation: [id number]   MR BRAIN WO CONTRAST Result Date: 04/11/2024 EXAM: MRI BRAIN WITHOUT CONTRAST 04/11/2024 06:15:11 AM TECHNIQUE: Multiplanar multisequence MRI of the head/brain was performed without the administration of intravenous contrast. COMPARISON: CT of the head dated 04/11/2024. CLINICAL HISTORY: Neuro deficit, acute, stroke suspected. Acute neurologic deficit; stroke suspected. FINDINGS: BRAIN AND VENTRICLES: There is no restricted diffusion to indicate acute or recent infarction. There are multiple foci of increased T2 signal within the deep cerebral white matter bilaterally. There is a partially empty sella. No intracranial hemorrhage. No mass. No midline shift. No hydrocephalus. Normal flow voids. ORBITS: No significant abnormality. SINUSES AND MASTOIDS: No significant abnormality. BONES AND SOFT TISSUES: Normal marrow signal. No soft  tissue abnormality. IMPRESSION: 1. No restricted diffusion to indicate acute or recent infarction. 2. Multiple foci of increased T2 signal within the deep cerebral white matter bilaterally, most commonly associated with ischemic small vessel disease. 3. Partially empty sella. Electronically signed by: Evalene Coho MD 04/11/2024 06:45 AM EST RP Workstation: [id number]   CT HEAD WO CONTRAST ( ) Addendum Date: 04/11/2024 ******** ADDENDUM #1 ******** ADDENDUM: Discussed over the phone with the Dr. Manfred 5:12 am, 04/11/2024, with verbal acknowledgement of findings. ---------------------------------------------------- Electronically signed by: Francis Quam MD 04/11/2024 05:15 AM EST RP Workstation: [id number]   Result  Date: 04/11/2024 ******** ORIGINAL REPORT ******** EXAM: CT HEAD WITHOUT CONTRAST 04/11/2024 04:22:00 AM TECHNIQUE: CT of the head was performed without the administration of intravenous contrast. Automated exposure control, iterative reconstruction, and/or weight based adjustment of the mA/kV was utilized to reduce the radiation dose to as low as reasonably achievable. COMPARISON: None available. CLINICAL HISTORY: Neuro deficit, acute, stroke suspected. Acute neurological deficit; stroke suspected. FINDINGS: BRAIN AND VENTRICLES: No acute hemorrhage. No evidence of acute infarct. No hydrocephalus. No extra-axial collection. No mass effect or midline shift. There is mild global cerebral volume loss, only slight cerebellar atrophy. There are no findings of significant small vessel disease in the cerebral white matter. There is trace mineralization in the basal ganglia. Calcific plaques in both distal vertebral arteries and carotid siphons. On the left, there is concern for a short segment hyperdense M2 left MCA branch in the sylvian fissure on axial series 3 image 15, coronal series 7 image 26, and sagittal reconstruction series 9 image 42. CTA head is recommended to exclude large vessel  occlusion. No other hyperdense vessel is suspected. Gray-white matter differentiation remains preserved throughout. ORBITS: No acute abnormality. SINUSES: There is trace mucosal disease in the right sphenoid air cell. Other sinuses are clear. SOFT TISSUES AND SKULL: No acute soft tissue abnormality. No skull fracture. Mastoid air cells and middle ears are clear with bilateral mastoid bone underpneumatization. IMPRESSION: 1. Concern for a hyperdense M2 left MCA branch in the sylvian fissure. CTA head is recommended to exclude large vessel occlusion. No other hyperdense vessel is suspected. 2. No other evidence of acute intracranial CT findings. 3. Bilateral mastoid bone underpneumatization. 4. PRA is attempting to reach the ordering physician at the time of signing. Electronically signed by: Francis Quam MD 04/11/2024 04:45 AM EST RP Workstation: [id number]   CT ABDOMEN PELVIS W CONTRAST Result Date: 04/10/2024 EXAM: CT ABDOMEN AND PELVIS WITH CONTRAST 04/10/2024 10:14:00 PM TECHNIQUE: CT of the abdomen and pelvis was performed with the administration of 75 mL of iohexol  (OMNIPAQUE ) 350 MG/ML injection. Multiplanar reformatted images are provided for review. Automated exposure control, iterative reconstruction, and/or weight-based adjustment of the mA/kV was utilized to reduce the radiation dose to as low as reasonably achievable. COMPARISON: Comparison with 03/16/2024. CLINICAL HISTORY: Abdominal pain, acute, nonlocalized. Acute, nonlocalized abdominal pain. FINDINGS: LOWER CHEST: No acute abnormality. LIVER: Hepatic steatosis. GALLBLADDER AND BILE DUCTS: Gallbladder is unremarkable. No biliary ductal dilatation. SPLEEN: No acute abnormality. PANCREAS: No acute abnormality. ADRENAL GLANDS: No acute abnormality. KIDNEYS, URETERS AND BLADDER: No stones in the kidneys or ureters. No hydronephrosis. No perinephric or periureteral stranding. Urinary bladder is unremarkable. GI AND BOWEL: Stomach demonstrates no acute  abnormality. There is no bowel obstruction. PERITONEUM AND RETROPERITONEUM: No ascites. No free air. VASCULATURE: Aorta is normal in caliber. Aortic atherosclerotic calcification. LYMPH NODES: No lymphadenopathy. REPRODUCTIVE ORGANS: No acute abnormality. BONES AND SOFT TISSUES: No acute osseous abnormality. No focal soft tissue abnormality. IMPRESSION: 1. No acute findings in the abdomen or pelvis. 2. Hepatic steatosis. Electronically signed by: Norman Gatlin MD 04/10/2024 10:30 PM EST RP Workstation: [id number]   DG Chest Port 1 View Result Date: 04/10/2024 CLINICAL DATA:  Initial evaluation for acute chest pain. EXAM: PORTABLE CHEST 1 VIEW COMPARISON:  Prior radiograph from 03/16/2024. FINDINGS: Cardiomegaly, stable.  Mediastinal silhouette within normal limits. Lungs are hypoinflated. Mild perihilar vascular congestion without overt pulmonary edema. No pleural effusion. No focal infiltrates. No pneumothorax. Visualized soft tissues and osseous structures demonstrate no acute finding. IMPRESSION: 1. Cardiomegaly with mild  perihilar vascular congestion without overt pulmonary edema. 2. No other active cardiopulmonary disease. Electronically Signed   By: Tracy Hoard M.D.   On: 04/10/2024 18:56   CT Angio Chest PE W/Cm &/Or Wo Cm Result Date: 03/16/2024 CLINICAL DATA:  Chest and low back pain with cough and flu-like symptoms as well as nausea and diarrhea 2-3 weeks. Fever. EXAM: CT ANGIOGRAPHY CHEST CT ABDOMEN AND PELVIS WITH CONTRAST TECHNIQUE: Multidetector CT imaging of the chest was performed using the standard protocol during bolus administration of intravenous contrast. Multiplanar CT image reconstructions and MIPs were obtained to evaluate the vascular anatomy. Multidetector CT imaging of the abdomen and pelvis was performed using the standard protocol during bolus administration of intravenous contrast. RADIATION DOSE REDUCTION: This exam was performed according to the departmental  dose-optimization program which includes automated exposure control, adjustment of the mA and/or kV according to patient size and/or use of iterative reconstruction technique. CONTRAST:  OMNIPAQUE  IOHEXOL  350 MG/ML SOLN COMPARISON:  None Available. FINDINGS: CTA CHEST FINDINGS Cardiovascular: Mild cardiomegaly. Calcified plaque over the left main and 3 vessel coronary arteries. Thoracic aorta is normal in caliber. Minimal calcified plaque over the descending thoracic aorta. Pulmonary arterial system is well opacified without evidence of emboli. Mediastinum/Nodes: No significant mediastinal or hilar adenopathy. Remaining mediastinal structures are unremarkable. Lungs/Pleura: Lungs are adequately inflated without focal airspace consolidation or effusion. Minimal linear atelectasis/scarring over the lingula. Airways are normal. Musculoskeletal: No focal abnormality. Review of the MIP images confirms the above findings. CT ABDOMEN and PELVIS FINDINGS Hepatobiliary: Gallbladder, liver and biliary tree are normal. Pancreas: Normal. Spleen: Normal. Adrenals/Urinary Tract: Adrenal glands are normal. Kidneys normal in size without hydronephrosis or nephrolithiasis. Ureters and bladder are normal. Stomach/Bowel: Stomach and small bowel are normal. Previous appendectomy. Mild diverticulosis of the colon without active inflammation. Vascular/Lymphatic: Minimal calcified plaque over the abdominal aorta which is normal caliber. Remaining vascular structures are unremarkable. No adenopathy. Reproductive: Uterus and bilateral adnexa are unremarkable. Other: No free peritoneal fluid or focal inflammatory change. Musculoskeletal: No focal abnormality. Review of the MIP images confirms the above findings. IMPRESSION: 1. No evidence of pulmonary embolism. 2. No acute findings in the chest, abdomen or pelvis. 3. Mild cardiomegaly. Atherosclerotic coronary artery disease. 4. Mild colonic diverticulosis without active inflammation.  5. Aortic atherosclerosis. Aortic Atherosclerosis (ICD10-I70.0). Electronically Signed   By: Toribio Agreste M.D.   On: 03/16/2024 17:03   CT ABDOMEN PELVIS W CONTRAST Result Date: 03/16/2024 CLINICAL DATA:  Chest and low back pain with cough and flu-like symptoms as well as nausea and diarrhea 2-3 weeks. Fever. EXAM: CT ANGIOGRAPHY CHEST CT ABDOMEN AND PELVIS WITH CONTRAST TECHNIQUE: Multidetector CT imaging of the chest was performed using the standard protocol during bolus administration of intravenous contrast. Multiplanar CT image reconstructions and MIPs were obtained to evaluate the vascular anatomy. Multidetector CT imaging of the abdomen and pelvis was performed using the standard protocol during bolus administration of intravenous contrast. RADIATION DOSE REDUCTION: This exam was performed according to the departmental dose-optimization program which includes automated exposure control, adjustment of the mA and/or kV according to patient size and/or use of iterative reconstruction technique. CONTRAST:  OMNIPAQUE  IOHEXOL  350 MG/ML SOLN COMPARISON:  None Available. FINDINGS: CTA CHEST FINDINGS Cardiovascular: Mild cardiomegaly. Calcified plaque over the left main and 3 vessel coronary arteries. Thoracic aorta is normal in caliber. Minimal calcified plaque over the descending thoracic aorta. Pulmonary arterial system is well opacified without evidence of emboli. Mediastinum/Nodes: No significant mediastinal or hilar adenopathy.  Remaining mediastinal structures are unremarkable. Lungs/Pleura: Lungs are adequately inflated without focal airspace consolidation or effusion. Minimal linear atelectasis/scarring over the lingula. Airways are normal. Musculoskeletal: No focal abnormality. Review of the MIP images confirms the above findings. CT ABDOMEN and PELVIS FINDINGS Hepatobiliary: Gallbladder, liver and biliary tree are normal. Pancreas: Normal. Spleen: Normal. Adrenals/Urinary Tract: Adrenal glands are  normal. Kidneys normal in size without hydronephrosis or nephrolithiasis. Ureters and bladder are normal. Stomach/Bowel: Stomach and small bowel are normal. Previous appendectomy. Mild diverticulosis of the colon without active inflammation. Vascular/Lymphatic: Minimal calcified plaque over the abdominal aorta which is normal caliber. Remaining vascular structures are unremarkable. No adenopathy. Reproductive: Uterus and bilateral adnexa are unremarkable. Other: No free peritoneal fluid or focal inflammatory change. Musculoskeletal: No focal abnormality. Review of the MIP images confirms the above findings. IMPRESSION: 1. No evidence of pulmonary embolism. 2. No acute findings in the chest, abdomen or pelvis. 3. Mild cardiomegaly. Atherosclerotic coronary artery disease. 4. Mild colonic diverticulosis without active inflammation. 5. Aortic atherosclerosis. Aortic Atherosclerosis (ICD10-I70.0). Electronically Signed   By: Toribio Agreste M.D.   On: 03/16/2024 17:03   DG Chest 2 View Result Date: 03/16/2024 CLINICAL DATA:  Cough and chest pain. EXAM: CHEST - 2 VIEW COMPARISON:  04/01/2020 FINDINGS: Lungs are hypoinflated without focal airspace consolidation or effusion. Cardiomediastinal silhouette and remainder of the exam is unchanged. IMPRESSION: Hypoinflation without acute cardiopulmonary disease. Electronically Signed   By: Toribio Agreste M.D.   On: 03/16/2024 15:08    Microbiology: Results for orders placed or performed during the hospital encounter of 04/10/24  Resp panel by RT-PCR (RSV, Flu A&B, Covid) Anterior Nasal Swab     Status: None   Collection Time: 04/10/24  6:28 PM   Specimen: Anterior Nasal Swab  Result Value Ref Range Status   SARS Coronavirus 2 by RT PCR NEGATIVE NEGATIVE Final   Influenza A by PCR NEGATIVE NEGATIVE Final   Influenza B by PCR NEGATIVE NEGATIVE Final    Comment: (NOTE) The Xpert Xpress SARS-CoV-2/FLU/RSV plus assay is intended as an aid in the diagnosis of influenza  from Nasopharyngeal swab specimens and should not be used as a sole basis for treatment. Nasal washings and aspirates are unacceptable for Xpert Xpress SARS-CoV-2/FLU/RSV testing.  Fact Sheet for Patients: bloggercourse.com  Fact Sheet for Healthcare Providers: seriousbroker.it  This test is not yet approved or cleared by the [place]  FDA and has been authorized for detection and/or diagnosis of SARS-CoV-2 by FDA under an Emergency Use Authorization (EUA). This EUA will remain in effect (meaning this test can be used) for the duration of the COVID-19 declaration under Section 564(b)(1) of the Act, 21 U.S.C. section 360bbb-3(b)(1), unless the authorization is terminated or revoked.     Resp Syncytial Virus by PCR NEGATIVE NEGATIVE Final    Comment: (NOTE) Fact Sheet for Patients: bloggercourse.com  Fact Sheet for Healthcare Providers: seriousbroker.it  This test is not yet approved or cleared by the [place]  FDA and has been authorized for detection and/or diagnosis of SARS-CoV-2 by FDA under an Emergency Use Authorization (EUA). This EUA will remain in effect (meaning this test can be used) for the duration of the COVID-19 declaration under Section 564(b)(1) of the Act, 21 U.S.C. section 360bbb-3(b)(1), unless the authorization is terminated or revoked.  Performed at [facility], [address] [place], [place] [address]     Labs: CBC: Recent Labs  Lab 04/10/24 1827 04/11/24 0132 04/11/24 1012 04/12/24 0644 04/12/24 1153 04/12/24 1212 04/13/24  0203  WBC 7.7 8.2 8.1 8.6  --   --  8.4  HGB 13.6 14.5 14.8 14.8 15.0 13.9 14.0  HCT 40.2 43.3 43.4 44.6 44.0 41.0 42.6  MCV 82.4 83.9 82.4 82.7  --   --  83.5  PLT 173 177 192 191  --   --  181   Basic Metabolic Panel: Recent Labs  Lab 04/10/24 1827 04/11/24 0132 04/11/24 1012 04/12/24 0644  04/12/24 1153 04/12/24 1212 04/13/24 0203 04/14/24 0158  NA 137  --  138 137 138 138 139 139  K 4.5  --  3.9 4.2 4.1 3.6 3.6 3.8  CL 101  --  100 97*  --   --  103 106  CO2 22  --  24 27  --   --  22 23  GLUCOSE 312*  --  265* 207*  --   --  146* 170*  BUN 20  --  14 22*  --   --  21* 21*  CREATININE 0.91 0.61 0.63 0.68  --   --  0.71 0.60  CALCIUM  9.6  --  9.6 10.3  --   --  8.9 8.9  MG  --   --  1.9  --   --   --   --   --   PHOS  --   --  4.2  --   --   --   --   --    Liver Function Tests: Recent Labs  Lab 04/10/24 1827 04/11/24 1012 04/12/24 0644  AST 38 33 33  ALT 27 28 34  ALKPHOS 64 68 73  BILITOT 0.2 0.4 0.6  PROT 6.9 7.6 7.7  ALBUMIN 3.9 4.4 4.4   CBG: Recent Labs  Lab 04/13/24 1153 04/13/24 1712 04/13/24 2118 04/14/24 0616 04/14/24 1126  GLUCAP 302* 236* 186* 175* 185*    Discharge time spent: {LESS THAN/GREATER THAN:26388} 30 minutes.  Signed: Casimer Dare, MD [facility] 04/14/2024 "

## 2024-04-14 NOTE — Plan of Care (Signed)
" °  Problem: Health Behavior/Discharge Planning: Goal: Ability to manage health-related needs will improve Outcome: Progressing   Problem: Metabolic: Goal: Ability to maintain appropriate glucose levels will improve Outcome: Progressing   Problem: Skin Integrity: Goal: Risk for impaired skin integrity will decrease Outcome: Progressing   Problem: Tissue Perfusion: Goal: Adequacy of tissue perfusion will improve Outcome: Progressing   Problem: Education: Goal: Knowledge of General Education information will improve Description: Including pain rating scale, medication(s)/side effects and non-pharmacologic comfort measures Outcome: Progressing   "

## 2024-04-15 ENCOUNTER — Telehealth (HOSPITAL_COMMUNITY): Payer: Self-pay

## 2024-04-15 NOTE — Telephone Encounter (Signed)
 Pt's daughter stated that pt is not going to be able to participate in the cardiac rehab program at this time.  Closed referral

## 2024-04-22 ENCOUNTER — Ambulatory Visit (HOSPITAL_COMMUNITY)

## 2024-05-19 ENCOUNTER — Ambulatory Visit: Payer: Self-pay | Admitting: Cardiology
